# Patient Record
Sex: Female | Born: 1985 | Race: White | Hispanic: No | Marital: Married | Smoking: Former smoker
Health system: Southern US, Community
[De-identification: ages and names within clinical notes are randomized; demographics above are authoritative.]

## PROBLEM LIST (undated history)

## (undated) DIAGNOSIS — M5416 Radiculopathy, lumbar region: Secondary | ICD-10-CM

## (undated) DIAGNOSIS — M2550 Pain in unspecified joint: Secondary | ICD-10-CM

## (undated) DIAGNOSIS — K2970 Gastritis, unspecified, without bleeding: Secondary | ICD-10-CM

## (undated) DIAGNOSIS — M5431 Sciatica, right side: Secondary | ICD-10-CM

## (undated) DIAGNOSIS — K219 Gastro-esophageal reflux disease without esophagitis: Secondary | ICD-10-CM

## (undated) DIAGNOSIS — E049 Nontoxic goiter, unspecified: Secondary | ICD-10-CM

## (undated) HISTORY — DX: Pain in unspecified joint: M25.50

## (undated) HISTORY — PX: OTHER SURGICAL HISTORY: SHX169

## (undated) HISTORY — DX: Gastro-esophageal reflux disease without esophagitis: K21.9

## (undated) HISTORY — DX: Radiculopathy, lumbar region: M54.16

## (undated) HISTORY — DX: Nontoxic goiter, unspecified: E04.9

## (undated) HISTORY — DX: Gastritis, unspecified, without bleeding: K29.70

## (undated) HISTORY — DX: Sciatica, right side: M54.31

---

## 2008-07-22 ENCOUNTER — Ambulatory Visit (HOSPITAL_COMMUNITY): Admission: RE | Admit: 2008-07-22 | Discharge: 2008-07-22 | Payer: Self-pay | Admitting: *Deleted

## 2008-12-30 ENCOUNTER — Emergency Department (HOSPITAL_COMMUNITY): Admission: EM | Admit: 2008-12-30 | Discharge: 2008-12-30 | Payer: Self-pay | Admitting: Emergency Medicine

## 2009-04-06 ENCOUNTER — Encounter: Admission: RE | Admit: 2009-04-06 | Discharge: 2009-04-06 | Payer: Self-pay | Admitting: Internal Medicine

## 2009-04-15 ENCOUNTER — Encounter: Admission: RE | Admit: 2009-04-15 | Discharge: 2009-04-15 | Payer: Self-pay | Admitting: Internal Medicine

## 2009-05-16 ENCOUNTER — Encounter: Admission: RE | Admit: 2009-05-16 | Discharge: 2009-05-16 | Payer: Self-pay | Admitting: Orthopedic Surgery

## 2009-10-05 HISTORY — PX: OTHER SURGICAL HISTORY: SHX169

## 2010-05-26 ENCOUNTER — Encounter: Admission: RE | Admit: 2010-05-26 | Discharge: 2010-05-26 | Payer: Self-pay | Admitting: Internal Medicine

## 2010-10-23 LAB — CBC
HCT: 36.4 % (ref 36.0–46.0)
Hemoglobin: 13.6 g/dL (ref 12.0–15.0)
WBC: 6.7 10*3/uL (ref 4.0–10.5)

## 2010-10-23 LAB — BASIC METABOLIC PANEL
Calcium: 9 mg/dL (ref 8.4–10.5)
GFR calc Af Amer: >60 mL/min (ref >60)
GFR calc non Af Amer: >60 mL/min (ref >60)
Potassium: 3.7 mEq/L (ref 3.5–5.1)
Sodium: 138 mEq/L (ref 135–145)

## 2010-10-23 LAB — DIFFERENTIAL
Basophils Absolute: 0 10*3/uL (ref 0.0–0.1)
Basophils Relative: 0 % (ref 0–1)
Lymphocytes Relative: 23 % (ref 12–46)
Monocytes Relative: 5 % (ref 3–12)
Neutro Abs: 4.8 10*3/uL (ref 1.7–7.7)
Neutrophils Relative %: 71 % (ref 43–77)

## 2010-10-23 LAB — POCT CARDIAC MARKERS
CKMB, poc: <1 ng/mL — ABNORMAL LOW (ref 1.0–8.0)
Troponin i, poc: <0.05 ng/mL (ref 0.00–0.09)

## 2010-10-23 LAB — D-DIMER, QUANTITATIVE: D-Dimer, Quant: 0.22 ug/mL-FEU (ref 0.00–0.48)

## 2012-04-29 ENCOUNTER — Other Ambulatory Visit: Payer: Self-pay | Admitting: Internal Medicine

## 2012-04-29 DIAGNOSIS — E049 Nontoxic goiter, unspecified: Secondary | ICD-10-CM

## 2012-05-08 ENCOUNTER — Ambulatory Visit
Admission: RE | Admit: 2012-05-08 | Discharge: 2012-05-08 | Disposition: A | Discharge status: Home / Self Care | Payer: Federal, State, Local not specified - PPO | Source: Ambulatory Visit | Attending: Internal Medicine | Admitting: Internal Medicine

## 2012-05-08 DIAGNOSIS — E049 Nontoxic goiter, unspecified: Secondary | ICD-10-CM

## 2014-11-04 ENCOUNTER — Other Ambulatory Visit (HOSPITAL_COMMUNITY): Payer: Self-pay | Admitting: Internal Medicine

## 2014-11-04 DIAGNOSIS — R1011 Right upper quadrant pain: Secondary | ICD-10-CM

## 2014-11-05 ENCOUNTER — Ambulatory Visit (HOSPITAL_COMMUNITY)
Admission: RE | Admit: 2014-11-05 | Discharge: 2014-11-05 | Disposition: A | Discharge status: Home / Self Care | Payer: Federal, State, Local not specified - PPO | Source: Ambulatory Visit | Attending: Internal Medicine | Admitting: Internal Medicine

## 2014-11-05 DIAGNOSIS — R1011 Right upper quadrant pain: Secondary | ICD-10-CM

## 2014-11-10 ENCOUNTER — Other Ambulatory Visit (HOSPITAL_COMMUNITY): Payer: Self-pay | Admitting: Internal Medicine

## 2014-11-10 DIAGNOSIS — R1011 Right upper quadrant pain: Secondary | ICD-10-CM

## 2014-11-10 DIAGNOSIS — R110 Nausea: Secondary | ICD-10-CM

## 2014-11-12 ENCOUNTER — Ambulatory Visit (HOSPITAL_COMMUNITY)
Admission: RE | Admit: 2014-11-12 | Discharge: 2014-11-12 | Disposition: A | Discharge status: Home / Self Care | Payer: Federal, State, Local not specified - PPO | Source: Ambulatory Visit | Attending: Internal Medicine | Admitting: Internal Medicine

## 2014-11-12 DIAGNOSIS — R1011 Right upper quadrant pain: Secondary | ICD-10-CM | POA: Diagnosis not present

## 2014-11-12 DIAGNOSIS — R110 Nausea: Secondary | ICD-10-CM | POA: Diagnosis not present

## 2014-11-12 MED ORDER — SODIUM CHLORIDE 0.9 % IJ SOLN
INTRAMUSCULAR | Status: AC
  Filled 2014-11-12: qty 45

## 2014-11-12 MED ORDER — IOHEXOL 300 MG/ML  SOLN
100.0000 mL | Freq: Once | INTRAMUSCULAR | Status: AC | PRN
  Administered 2014-11-12: 100 mL via INTRAVENOUS

## 2014-11-12 MED ORDER — SODIUM CHLORIDE 0.9 % IJ SOLN
INTRAMUSCULAR | Status: AC
  Filled 2014-11-12: qty 750

## 2015-11-11 DIAGNOSIS — M9903 Segmental and somatic dysfunction of lumbar region: Secondary | ICD-10-CM | POA: Diagnosis not present

## 2015-11-11 DIAGNOSIS — M9902 Segmental and somatic dysfunction of thoracic region: Secondary | ICD-10-CM | POA: Diagnosis not present

## 2015-11-11 DIAGNOSIS — M546 Pain in thoracic spine: Secondary | ICD-10-CM | POA: Diagnosis not present

## 2015-11-11 DIAGNOSIS — M545 Low back pain: Secondary | ICD-10-CM | POA: Diagnosis not present

## 2015-12-09 DIAGNOSIS — M545 Low back pain: Secondary | ICD-10-CM | POA: Diagnosis not present

## 2015-12-09 DIAGNOSIS — M9903 Segmental and somatic dysfunction of lumbar region: Secondary | ICD-10-CM | POA: Diagnosis not present

## 2015-12-09 DIAGNOSIS — M9902 Segmental and somatic dysfunction of thoracic region: Secondary | ICD-10-CM | POA: Diagnosis not present

## 2015-12-09 DIAGNOSIS — M546 Pain in thoracic spine: Secondary | ICD-10-CM | POA: Diagnosis not present

## 2016-02-16 DIAGNOSIS — R188 Other ascites: Secondary | ICD-10-CM | POA: Diagnosis not present

## 2016-02-16 DIAGNOSIS — R102 Pelvic and perineal pain: Secondary | ICD-10-CM | POA: Diagnosis not present

## 2016-02-17 DIAGNOSIS — R102 Pelvic and perineal pain: Secondary | ICD-10-CM | POA: Diagnosis not present

## 2016-02-21 ENCOUNTER — Other Ambulatory Visit: Payer: Self-pay | Admitting: Internal Medicine

## 2016-02-21 ENCOUNTER — Other Ambulatory Visit (HOSPITAL_COMMUNITY): Payer: Self-pay | Admitting: Internal Medicine

## 2016-02-21 ENCOUNTER — Ambulatory Visit (HOSPITAL_COMMUNITY)
Admission: RE | Admit: 2016-02-21 | Discharge: 2016-02-21 | Disposition: A | Discharge status: Home / Self Care | Payer: Federal, State, Local not specified - PPO | Source: Ambulatory Visit | Attending: Internal Medicine | Admitting: Internal Medicine

## 2016-02-21 DIAGNOSIS — R1031 Right lower quadrant pain: Secondary | ICD-10-CM

## 2016-02-21 DIAGNOSIS — R140 Abdominal distension (gaseous): Secondary | ICD-10-CM

## 2016-02-21 DIAGNOSIS — N83201 Unspecified ovarian cyst, right side: Secondary | ICD-10-CM | POA: Insufficient documentation

## 2016-02-21 DIAGNOSIS — R110 Nausea: Secondary | ICD-10-CM | POA: Insufficient documentation

## 2016-02-21 MED ORDER — IOPAMIDOL (ISOVUE-300) INJECTION 61%
100.0000 mL | Freq: Once | INTRAVENOUS | Status: AC | PRN
  Administered 2016-02-21: 100 mL via INTRAVENOUS

## 2016-02-23 ENCOUNTER — Ambulatory Visit (HOSPITAL_COMMUNITY): Payer: Federal, State, Local not specified - PPO

## 2016-02-29 DIAGNOSIS — K5900 Constipation, unspecified: Secondary | ICD-10-CM | POA: Diagnosis not present

## 2016-02-29 DIAGNOSIS — R109 Unspecified abdominal pain: Secondary | ICD-10-CM | POA: Diagnosis not present

## 2016-03-01 ENCOUNTER — Encounter: Payer: Self-pay | Admitting: Internal Medicine

## 2016-04-06 DIAGNOSIS — M545 Low back pain: Secondary | ICD-10-CM | POA: Diagnosis not present

## 2016-04-06 DIAGNOSIS — M9902 Segmental and somatic dysfunction of thoracic region: Secondary | ICD-10-CM | POA: Diagnosis not present

## 2016-04-06 DIAGNOSIS — M9903 Segmental and somatic dysfunction of lumbar region: Secondary | ICD-10-CM | POA: Diagnosis not present

## 2016-04-06 DIAGNOSIS — M546 Pain in thoracic spine: Secondary | ICD-10-CM | POA: Diagnosis not present

## 2016-05-10 ENCOUNTER — Ambulatory Visit (INDEPENDENT_AMBULATORY_CARE_PROVIDER_SITE_OTHER): Payer: Federal, State, Local not specified - PPO | Admitting: Internal Medicine

## 2016-05-10 ENCOUNTER — Encounter (INDEPENDENT_AMBULATORY_CARE_PROVIDER_SITE_OTHER): Payer: Self-pay

## 2016-05-10 ENCOUNTER — Encounter: Payer: Self-pay | Admitting: Internal Medicine

## 2016-05-10 VITALS — BP 102/62 | HR 74 | Ht 61.0 in | Wt 132.0 lb

## 2016-05-10 DIAGNOSIS — M9902 Segmental and somatic dysfunction of thoracic region: Secondary | ICD-10-CM | POA: Diagnosis not present

## 2016-05-10 DIAGNOSIS — R1031 Right lower quadrant pain: Secondary | ICD-10-CM | POA: Diagnosis not present

## 2016-05-10 DIAGNOSIS — R10813 Right lower quadrant abdominal tenderness: Secondary | ICD-10-CM

## 2016-05-10 DIAGNOSIS — M9903 Segmental and somatic dysfunction of lumbar region: Secondary | ICD-10-CM | POA: Diagnosis not present

## 2016-05-10 DIAGNOSIS — R194 Change in bowel habit: Secondary | ICD-10-CM | POA: Diagnosis not present

## 2016-05-10 DIAGNOSIS — M546 Pain in thoracic spine: Secondary | ICD-10-CM | POA: Diagnosis not present

## 2016-05-10 DIAGNOSIS — M545 Low back pain: Secondary | ICD-10-CM | POA: Diagnosis not present

## 2016-05-10 MED ORDER — DICYCLOMINE HCL 20 MG PO TABS: 20.0000 mg | ORAL_TABLET | Freq: Four times a day (QID) | ORAL | 0 refills | Status: AC | PRN

## 2016-05-10 NOTE — Patient Instructions (Signed)
   We have sent the following medications to your pharmacy for you to pick up at your convenience: Bentyl   You have been scheduled for a colonoscopy. Please follow written instructions given to you at your visit today.  Please pick up your prep supplies at the pharmacy. If you use inhalers (even only as needed), please bring them with you on the day of your procedure. Your physician has requested that you go to www.startemmi.com and enter the access code given to you at your visit today. This web site gives a general overview about your procedure. However, you should still follow specific instructions given to you by our office regarding your preparation for the procedure.     I appreciate the opportunity to care for you. Denice Paradisearl Gessner,MD, [facility]

## 2016-05-10 NOTE — Progress Notes (Signed)
Misty Mendoza 30 y.o. 01/18/1986 [id number] Referred by: Georgianne Fick, MD  Assessment & Plan:   Encounter Diagnoses  Name Primary?  . RLQ abdominal pain Yes  . Change in bowel habits   . Right lower quadrant abdominal tenderness without rebound tenderness    Cause not clear - fullness in RLQ  IBD - ? Crohn's Cancer possible but seems unlikely by age and CT findings  If colonoscopy negative could need laporoscpy  The risks and benefits as well as alternatives of endoscopic procedure(s) have been discussed and reviewed. All questions answered. The patient agrees to proceed.  Trial of dicyclomine  I appreciate the opportunity to care for this patient. CC: RAMACHANDRAN,AJITH, MD    Subjective:   Chief Complaint: Right lower quadrant pain  HPI There are nice 30 year old married white woman who began to have right lower quadrant pain after returning from a month tore of duty in [place], she is full-time Hydrographic surveyor. She was there, she started her. When she came home and she developed some right lower quadrant pain. Not unusual, does have a history of ovarian cysts. Pain was somewhat similar but it persisted. It's been associated with a change in bowel habits where she is either loose runny or soft but not formed and regular. Denies any bleeding. Has had CT scan of the abdomen and pelvis as well as a pelvic ultrasound or gynecology office. Ovarian cyst seen, but even after those have resolved significant persistent pain and a change in bowel habits is reported. Denies unintentional weight loss. I have reviewed her primary care notes from Dr. Nicholos Johns. A grandmother and aunt have had colon cancer. The patient is somewhat concerned about the family history and her persistent symptoms. She's had some abdominal pain before but never persistent like this for weeks at a time. Primary care note of 02/29/2016 reviewed. No Known Allergies No outpatient prescriptions prior  to visit.   No facility-administered medications prior to visit.    Past Medical History:  Diagnosis Date  . Arthralgia    right hip  . Gastritis   . Lumbar radiculopathy, right   . Sciatica of right side   . Thyroid enlargement    right   Past Surgical History:  Procedure Laterality Date  . right hip arthroscopy  10/05/2009   Social History   Social History  . Marital status: Married    Spouse name: N/A  . Number of children: 1  . Years of education: N/A   Occupational History  . HR    Social History Main Topics  . Smoking status: Never Smoker  . Smokeless tobacco: Never Used  . Alcohol use Yes     Comment: rarely- social events  . Drug use: No  . Sexual activity: Not Asked   Other Topics Concern  . 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 She's married with one daughter. One caffeinated beverage daily no alcohol.   05/10/2016      Family History  Problem Relation Age of Onset  . Heart attack Father     in his 19's   . Heart disease Father   . Breast cancer Mother   . Colon cancer Maternal Grandmother   . Colon cancer Maternal Aunt         Review of Systems  As per history of present illness. Low back pain. All other review of systems negative. Objective:   Physical Exam @BP  102/62  Pulse 74   Ht 5\' 1"  (1.549 m)   Wt 132 lb (59.9 kg)   LMP 05/02/2016 (Exact Date)   BMI 24.94 kg/m @  General:  Well-developed, well-nourished and in no acute distress Eyes:  anicteric. ENT:   Mouth and posterior pharynx free of lesions.  Neck:   supple w/o thyromegaly or mass.  Lungs: Clear to auscultation bilaterally. Heart:  S1S2, no rubs, murmurs, gallops. Abdomen:  soft, mildly tender w/ fullness in RLQ, no hepatosplenomegaly, hernia, or mass and BS+.  Rectal: deferred Lymph:  no cervical or supraclavicular adenopathy. Extremities:   no edema, cyanosis or clubbing Skin   no rash. Tattoo low back Neuro:    A&O x 3.  Psych:  appropriate mood and  Affect.   Data Reviewed:   IMPRESSION: 1. No definite explanation for the patient's right lower quadrant pain is seen. The appendix and terminal ileum are unremarkable. No edema of large or small bowel is noted. 2. No renal or ureteral calculi are noted. 3. 1.9 cm right ovarian cyst with a moderate amount of free fluid in the pelvis, possibly due to a recently ruptured ovarian cyst.   Electronically Signed   By: Dwyane DeePaul  Barry M.D.   On: 02/21/2016 12:10

## 2016-05-25 ENCOUNTER — Encounter: Payer: Self-pay | Admitting: Internal Medicine

## 2016-05-25 ENCOUNTER — Telehealth: Payer: Self-pay

## 2016-05-25 ENCOUNTER — Ambulatory Visit (AMBULATORY_SURGERY_CENTER): Payer: Federal, State, Local not specified - PPO | Admitting: Internal Medicine

## 2016-05-25 VITALS — BP 121/65 | HR 65 | Temp 98.6°F | Resp 15 | Ht 61.0 in | Wt 132.0 lb

## 2016-05-25 DIAGNOSIS — R195 Other fecal abnormalities: Secondary | ICD-10-CM | POA: Diagnosis not present

## 2016-05-25 DIAGNOSIS — R1031 Right lower quadrant pain: Secondary | ICD-10-CM | POA: Diagnosis not present

## 2016-05-25 DIAGNOSIS — Z1211 Encounter for screening for malignant neoplasm of colon: Secondary | ICD-10-CM | POA: Diagnosis not present

## 2016-05-25 MED ORDER — SODIUM CHLORIDE 0.9 % IV SOLN: 500.0000 mL | INTRAVENOUS | Status: AC

## 2016-05-25 NOTE — Patient Instructions (Addendum)
Everything looks ok.  It sounds like you have IBS or Irritable Bowel syndrome.   Will discuss other treatment.  May need to consider a diagnostic laparoscopy.  I appreciate the opportunity to care for you. Iva Booparl E. Careena Degraffenreid, MD, FACG YOU HAD AN ENDOSCOPIC PROCEDURE TODAY AT THE [facility]:   Refer to the procedure report that was given to you for any specific questions about what was found during the examination.  If the procedure report does not answer your questions, please call your gastroenterologist to clarify.  If you requested that your care partner not be given the details of your procedure findings, then the procedure report has been included in a sealed envelope for you to review at your convenience later.  YOU SHOULD EXPECT: Some feelings of bloating in the abdomen. Passage of more gas than usual.  Walking can help get rid of the air that was put into your GI tract during the procedure and reduce the bloating. If you had a lower endoscopy (such as a colonoscopy or flexible sigmoidoscopy) you may notice spotting of blood in your stool or on the toilet paper. If you underwent a bowel prep for your procedure, you may not have a normal bowel movement for a few days.  Please Note:  You might notice some irritation and congestion in your nose or some drainage.  This is from the oxygen used during your procedure.  There is no need for concern and it should clear up in a day or so.  SYMPTOMS TO REPORT IMMEDIATELY:   Following lower endoscopy (colonoscopy or flexible sigmoidoscopy):  Excessive amounts of blood in the stool  Significant tenderness or worsening of abdominal pains  Swelling of the abdomen that is new, acute  Fever of 100F or higher   Following upper endoscopy (EGD)  Vomiting of blood or coffee ground material  New chest pain or pain under the shoulder blades  Painful or persistently difficult swallowing  New shortness of breath  Fever of 100F or  higher  Black, tarry-looking stools  For urgent or emergent issues, a gastroenterologist can be reached at any hour by calling [phone].   DIET:  We do recommend a small meal at first, but then you may proceed to your regular diet.  Drink plenty of fluids but you should avoid alcoholic beverages for 24 hours.  ACTIVITY:  You should plan to take it easy for the rest of today and you should NOT DRIVE or use heavy machinery until tomorrow (because of the sedation medicines used during the test).    FOLLOW UP: Our staff will call the number listed on your records the next business day following your procedure to check on you and address any questions or concerns that you may have regarding the information given to you following your procedure. If we do not reach you, we will leave a message.  However, if you are feeling well and you are not experiencing any problems, there is no need to return our call.  We will assume that you have returned to your regular daily activities without incident.  If any biopsies were taken you will be contacted by phone or by letter within the next 1-3 weeks.  Please call us at [phone] if you have not heard about the biopsies in 3 weeks.    SIGNATURES/CONFIDENTIALITY: You and/or your care partner have signed paperwork which will be entered into your electronic medical record.  These signatures attest to the fact that  that the information above on your After Visit Summary has been reviewed and is understood.  Full responsibility of the confidentiality of this discharge information lies with you and/or your care-partner.  Will do lactulose breath atest.  May need to consider laparoscopy.

## 2016-05-25 NOTE — Op Note (Addendum)
[facility] Patient Name: Misty BradleyJulie Mendoza Procedure Date: 05/25/2016 9:12 AM MRN: [id number] Endoscopist: Iva Booparl E Gessner , MD Age: 30 Referring MD:  Date of Birth: Dec 23, 1985 Gender: Female Account #: [id number] Procedure:                Colonoscopy Indications:              Abdominal pain in the right lower quadrant,                            Clinically significant diarrhea of unexplained                            origin Medicines:                Propofol per Anesthesia, Monitored Anesthesia Care Procedure:                Pre-Anesthesia Assessment:                           - Prior to the procedure, a History and Physical                            was performed, and patient medications and                            allergies were reviewed. The patient's tolerance of                            previous anesthesia was also reviewed. The risks                            and benefits of the procedure and the sedation                            options and risks were discussed with the patient.                            All questions were answered, and informed consent                            was obtained. Prior Anticoagulants: The patient has                            taken no previous anticoagulant or antiplatelet                            agents. ASA Grade Assessment: II - A patient with                            mild systemic disease. After reviewing the risks                            and benefits, the patient was deemed in  satisfactory condition to undergo the procedure.                           After obtaining informed consent, the colonoscope                            was passed under direct vision. Throughout the                            procedure, the patient's blood pressure, pulse, and                            oxygen saturations were monitored continuously. The                            Model PCF-H190DL [id number](SN#[id number]) scope  was introduced                            through the anus and advanced to the the terminal                            ileum, with identification of the appendiceal                            orifice and IC valve. The colonoscopy was performed                            without difficulty. The patient tolerated the                            procedure well. The quality of the bowel                            preparation was excellent. The bowel preparation                            used was Miralax. The terminal ileum, the                            appendiceal orifice and the rectum were                            photographed. Scope In: 9:20:58 AM Scope Out: 9:35:03 AM Scope Withdrawal Time: 0 hours 9 minutes 58 seconds  Total Procedure Duration: 0 hours 14 minutes 5 seconds  Findings:                 The perianal and digital rectal examinations were                            normal.                           The terminal ileum appeared normal.  The entire examined colon appeared normal on direct                            and retroflexion views. Complications:            No immediate complications. Estimated Blood Loss:     Estimated blood loss: none. Impression:               - The examined portion of the ileum was normal.                           - The entire examined colon is normal on direct and                            retroflexion views.                           - No specimens collected. Recommendation:           - Patient has a contact number available for                            emergencies. The signs and symptoms of potential                            delayed complications were discussed with the                            patient. Return to normal activities tomorrow.                            Written discharge instructions were provided to the                            patient.                           - Resume previous diet.                            - Continue present medications.                           - No repeat colonoscopy due to age.                           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 SIBO - bloating, loose stools                            and pain - will do a lactulose hydrogen breath test                           Dicyclomine dc - "made me feel terrible"  May still need to consider a laparoscopy                           cc: Dr. Quinn Axe, [place] Iva Boop, MD 05/25/2016 9:48:38 AM This report has been signed electronically.

## 2016-05-25 NOTE — Telephone Encounter (Signed)
-----   Message from Iva Booparl E Gessner, MD sent at 05/25/2016  9:59 AM EST ----- Regarding: breath test Need to have her do a lactulose hydrogen breat test  Dx diarrhea, abdominal bloating , RLQ pain

## 2016-05-25 NOTE — Progress Notes (Signed)
A and O x3. Report to RN. Tolerated MAC anesthesia well. 

## 2016-05-28 ENCOUNTER — Telehealth: Payer: Self-pay | Admitting: *Deleted

## 2016-05-28 NOTE — Telephone Encounter (Signed)
Per Dr. Leone PayorGessner patient needs to wait one month after colonoscopy to do breath test.  Patient notified and I mailed her the form to sign.  She will mail it back or fax it back to me.

## 2016-05-28 NOTE — Telephone Encounter (Signed)
  Follow up Call-  Call back number 05/25/2016  Post procedure Call Back phone  # [phone]  Permission to leave phone message Yes  Some recent data might be hidden     Patient questions:  Do you have a fever, pain , or abdominal swelling? No. Pain Score  0 *  Have you tolerated food without any problems? Yes.    Have you been able to return to your normal activities? Yes.    Do you have any questions about your discharge instructions: Diet   No. Medications  No. Follow up visit  No.  Do you have questions or concerns about your Care? No.  Actions: * If pain score is 4 or above: No action needed, pain <4.

## 2016-06-11 DIAGNOSIS — M546 Pain in thoracic spine: Secondary | ICD-10-CM | POA: Diagnosis not present

## 2016-06-11 DIAGNOSIS — M9902 Segmental and somatic dysfunction of thoracic region: Secondary | ICD-10-CM | POA: Diagnosis not present

## 2016-06-11 DIAGNOSIS — Z01419 Encounter for gynecological examination (general) (routine) without abnormal findings: Secondary | ICD-10-CM | POA: Diagnosis not present

## 2016-06-11 DIAGNOSIS — M9903 Segmental and somatic dysfunction of lumbar region: Secondary | ICD-10-CM | POA: Diagnosis not present

## 2016-06-11 DIAGNOSIS — Z6825 Body mass index (BMI) 25.0-25.9, adult: Secondary | ICD-10-CM | POA: Diagnosis not present

## 2016-06-11 DIAGNOSIS — M545 Low back pain: Secondary | ICD-10-CM | POA: Diagnosis not present

## 2016-07-06 DIAGNOSIS — M9903 Segmental and somatic dysfunction of lumbar region: Secondary | ICD-10-CM | POA: Diagnosis not present

## 2016-07-06 DIAGNOSIS — M545 Low back pain: Secondary | ICD-10-CM | POA: Diagnosis not present

## 2016-07-06 DIAGNOSIS — M546 Pain in thoracic spine: Secondary | ICD-10-CM | POA: Diagnosis not present

## 2016-07-06 DIAGNOSIS — M9902 Segmental and somatic dysfunction of thoracic region: Secondary | ICD-10-CM | POA: Diagnosis not present

## 2016-08-06 DIAGNOSIS — M9901 Segmental and somatic dysfunction of cervical region: Secondary | ICD-10-CM | POA: Diagnosis not present

## 2016-08-06 DIAGNOSIS — N809 Endometriosis, unspecified: Secondary | ICD-10-CM | POA: Diagnosis not present

## 2016-08-06 DIAGNOSIS — Z3202 Encounter for pregnancy test, result negative: Secondary | ICD-10-CM | POA: Diagnosis not present

## 2016-08-06 DIAGNOSIS — M542 Cervicalgia: Secondary | ICD-10-CM | POA: Diagnosis not present

## 2016-08-06 DIAGNOSIS — M9903 Segmental and somatic dysfunction of lumbar region: Secondary | ICD-10-CM | POA: Diagnosis not present

## 2016-08-06 DIAGNOSIS — M545 Low back pain: Secondary | ICD-10-CM | POA: Diagnosis not present

## 2016-08-06 DIAGNOSIS — Z6825 Body mass index (BMI) 25.0-25.9, adult: Secondary | ICD-10-CM | POA: Diagnosis not present

## 2016-08-14 DIAGNOSIS — Z13220 Encounter for screening for lipoid disorders: Secondary | ICD-10-CM | POA: Diagnosis not present

## 2016-08-14 DIAGNOSIS — Z0000 Encounter for general adult medical examination without abnormal findings: Secondary | ICD-10-CM | POA: Diagnosis not present

## 2016-08-21 DIAGNOSIS — M25551 Pain in right hip: Secondary | ICD-10-CM | POA: Diagnosis not present

## 2016-08-21 DIAGNOSIS — R1031 Right lower quadrant pain: Secondary | ICD-10-CM | POA: Diagnosis not present

## 2016-08-21 DIAGNOSIS — Z0000 Encounter for general adult medical examination without abnormal findings: Secondary | ICD-10-CM | POA: Diagnosis not present

## 2016-08-27 DIAGNOSIS — R102 Pelvic and perineal pain: Secondary | ICD-10-CM | POA: Diagnosis not present

## 2016-08-27 DIAGNOSIS — G8929 Other chronic pain: Secondary | ICD-10-CM | POA: Diagnosis not present

## 2016-08-27 DIAGNOSIS — N803 Endometriosis of pelvic peritoneum: Secondary | ICD-10-CM | POA: Diagnosis not present

## 2016-08-27 DIAGNOSIS — Z833 Family history of diabetes mellitus: Secondary | ICD-10-CM | POA: Diagnosis not present

## 2016-08-27 DIAGNOSIS — Z793 Long term (current) use of hormonal contraceptives: Secondary | ICD-10-CM | POA: Diagnosis not present

## 2016-08-27 DIAGNOSIS — Z87891 Personal history of nicotine dependence: Secondary | ICD-10-CM | POA: Diagnosis not present

## 2016-08-28 DIAGNOSIS — Z87891 Personal history of nicotine dependence: Secondary | ICD-10-CM | POA: Diagnosis not present

## 2016-08-28 DIAGNOSIS — Z793 Long term (current) use of hormonal contraceptives: Secondary | ICD-10-CM | POA: Diagnosis not present

## 2016-08-28 DIAGNOSIS — R102 Pelvic and perineal pain: Secondary | ICD-10-CM | POA: Diagnosis not present

## 2016-08-28 DIAGNOSIS — Z833 Family history of diabetes mellitus: Secondary | ICD-10-CM | POA: Diagnosis not present

## 2016-08-28 DIAGNOSIS — N803 Endometriosis of pelvic peritoneum: Secondary | ICD-10-CM | POA: Diagnosis not present

## 2016-08-28 DIAGNOSIS — G8929 Other chronic pain: Secondary | ICD-10-CM | POA: Diagnosis not present

## 2016-09-10 DIAGNOSIS — M542 Cervicalgia: Secondary | ICD-10-CM | POA: Diagnosis not present

## 2016-09-10 DIAGNOSIS — M545 Low back pain: Secondary | ICD-10-CM | POA: Diagnosis not present

## 2016-09-10 DIAGNOSIS — M9903 Segmental and somatic dysfunction of lumbar region: Secondary | ICD-10-CM | POA: Diagnosis not present

## 2016-09-10 DIAGNOSIS — M9901 Segmental and somatic dysfunction of cervical region: Secondary | ICD-10-CM | POA: Diagnosis not present

## 2016-10-08 DIAGNOSIS — M9901 Segmental and somatic dysfunction of cervical region: Secondary | ICD-10-CM | POA: Diagnosis not present

## 2016-10-08 DIAGNOSIS — M542 Cervicalgia: Secondary | ICD-10-CM | POA: Diagnosis not present

## 2016-10-08 DIAGNOSIS — M545 Low back pain: Secondary | ICD-10-CM | POA: Diagnosis not present

## 2016-10-08 DIAGNOSIS — M9903 Segmental and somatic dysfunction of lumbar region: Secondary | ICD-10-CM | POA: Diagnosis not present

## 2016-11-14 DIAGNOSIS — M9901 Segmental and somatic dysfunction of cervical region: Secondary | ICD-10-CM | POA: Diagnosis not present

## 2016-11-14 DIAGNOSIS — M545 Low back pain: Secondary | ICD-10-CM | POA: Diagnosis not present

## 2016-11-14 DIAGNOSIS — M542 Cervicalgia: Secondary | ICD-10-CM | POA: Diagnosis not present

## 2016-11-14 DIAGNOSIS — M9903 Segmental and somatic dysfunction of lumbar region: Secondary | ICD-10-CM | POA: Diagnosis not present

## 2016-11-15 DIAGNOSIS — M545 Low back pain: Secondary | ICD-10-CM | POA: Diagnosis not present

## 2016-11-15 DIAGNOSIS — M542 Cervicalgia: Secondary | ICD-10-CM | POA: Diagnosis not present

## 2016-11-15 DIAGNOSIS — M9903 Segmental and somatic dysfunction of lumbar region: Secondary | ICD-10-CM | POA: Diagnosis not present

## 2016-11-15 DIAGNOSIS — M9901 Segmental and somatic dysfunction of cervical region: Secondary | ICD-10-CM | POA: Diagnosis not present

## 2016-12-12 DIAGNOSIS — M545 Low back pain: Secondary | ICD-10-CM | POA: Diagnosis not present

## 2016-12-12 DIAGNOSIS — M9903 Segmental and somatic dysfunction of lumbar region: Secondary | ICD-10-CM | POA: Diagnosis not present

## 2016-12-12 DIAGNOSIS — M9901 Segmental and somatic dysfunction of cervical region: Secondary | ICD-10-CM | POA: Diagnosis not present

## 2016-12-12 DIAGNOSIS — M542 Cervicalgia: Secondary | ICD-10-CM | POA: Diagnosis not present

## 2017-01-09 DIAGNOSIS — M542 Cervicalgia: Secondary | ICD-10-CM | POA: Diagnosis not present

## 2017-01-09 DIAGNOSIS — M9903 Segmental and somatic dysfunction of lumbar region: Secondary | ICD-10-CM | POA: Diagnosis not present

## 2017-01-09 DIAGNOSIS — M9901 Segmental and somatic dysfunction of cervical region: Secondary | ICD-10-CM | POA: Diagnosis not present

## 2017-01-09 DIAGNOSIS — M545 Low back pain: Secondary | ICD-10-CM | POA: Diagnosis not present

## 2017-01-14 DIAGNOSIS — Z9889 Other specified postprocedural states: Secondary | ICD-10-CM | POA: Diagnosis not present

## 2017-01-14 DIAGNOSIS — M25551 Pain in right hip: Secondary | ICD-10-CM | POA: Diagnosis not present

## 2017-02-11 DIAGNOSIS — M542 Cervicalgia: Secondary | ICD-10-CM | POA: Diagnosis not present

## 2017-02-11 DIAGNOSIS — M9901 Segmental and somatic dysfunction of cervical region: Secondary | ICD-10-CM | POA: Diagnosis not present

## 2017-02-11 DIAGNOSIS — M545 Low back pain: Secondary | ICD-10-CM | POA: Diagnosis not present

## 2017-02-11 DIAGNOSIS — M9903 Segmental and somatic dysfunction of lumbar region: Secondary | ICD-10-CM | POA: Diagnosis not present

## 2017-03-14 DIAGNOSIS — M9903 Segmental and somatic dysfunction of lumbar region: Secondary | ICD-10-CM | POA: Diagnosis not present

## 2017-03-14 DIAGNOSIS — M542 Cervicalgia: Secondary | ICD-10-CM | POA: Diagnosis not present

## 2017-03-14 DIAGNOSIS — M9901 Segmental and somatic dysfunction of cervical region: Secondary | ICD-10-CM | POA: Diagnosis not present

## 2017-03-14 DIAGNOSIS — M545 Low back pain: Secondary | ICD-10-CM | POA: Diagnosis not present

## 2017-04-16 DIAGNOSIS — M545 Low back pain: Secondary | ICD-10-CM | POA: Diagnosis not present

## 2017-04-16 DIAGNOSIS — M9903 Segmental and somatic dysfunction of lumbar region: Secondary | ICD-10-CM | POA: Diagnosis not present

## 2017-04-16 DIAGNOSIS — M9901 Segmental and somatic dysfunction of cervical region: Secondary | ICD-10-CM | POA: Diagnosis not present

## 2017-04-16 DIAGNOSIS — M542 Cervicalgia: Secondary | ICD-10-CM | POA: Diagnosis not present

## 2017-05-21 DIAGNOSIS — M9901 Segmental and somatic dysfunction of cervical region: Secondary | ICD-10-CM | POA: Diagnosis not present

## 2017-05-21 DIAGNOSIS — M542 Cervicalgia: Secondary | ICD-10-CM | POA: Diagnosis not present

## 2017-05-21 DIAGNOSIS — M545 Low back pain: Secondary | ICD-10-CM | POA: Diagnosis not present

## 2017-05-21 DIAGNOSIS — M9903 Segmental and somatic dysfunction of lumbar region: Secondary | ICD-10-CM | POA: Diagnosis not present

## 2017-06-12 DIAGNOSIS — Z01419 Encounter for gynecological examination (general) (routine) without abnormal findings: Secondary | ICD-10-CM | POA: Diagnosis not present

## 2017-06-12 DIAGNOSIS — Z6824 Body mass index (BMI) 24.0-24.9, adult: Secondary | ICD-10-CM | POA: Diagnosis not present

## 2017-06-28 DIAGNOSIS — M9903 Segmental and somatic dysfunction of lumbar region: Secondary | ICD-10-CM | POA: Diagnosis not present

## 2017-06-28 DIAGNOSIS — M542 Cervicalgia: Secondary | ICD-10-CM | POA: Diagnosis not present

## 2017-06-28 DIAGNOSIS — M545 Low back pain: Secondary | ICD-10-CM | POA: Diagnosis not present

## 2017-06-28 DIAGNOSIS — M9901 Segmental and somatic dysfunction of cervical region: Secondary | ICD-10-CM | POA: Diagnosis not present

## 2017-07-22 DIAGNOSIS — M545 Low back pain: Secondary | ICD-10-CM | POA: Diagnosis not present

## 2017-07-22 DIAGNOSIS — M542 Cervicalgia: Secondary | ICD-10-CM | POA: Diagnosis not present

## 2017-07-22 DIAGNOSIS — M9903 Segmental and somatic dysfunction of lumbar region: Secondary | ICD-10-CM | POA: Diagnosis not present

## 2017-07-22 DIAGNOSIS — M9901 Segmental and somatic dysfunction of cervical region: Secondary | ICD-10-CM | POA: Diagnosis not present

## 2017-08-19 DIAGNOSIS — M542 Cervicalgia: Secondary | ICD-10-CM | POA: Diagnosis not present

## 2017-08-19 DIAGNOSIS — M545 Low back pain: Secondary | ICD-10-CM | POA: Diagnosis not present

## 2017-08-19 DIAGNOSIS — M9901 Segmental and somatic dysfunction of cervical region: Secondary | ICD-10-CM | POA: Diagnosis not present

## 2017-08-19 DIAGNOSIS — M9903 Segmental and somatic dysfunction of lumbar region: Secondary | ICD-10-CM | POA: Diagnosis not present

## 2017-08-21 DIAGNOSIS — Z131 Encounter for screening for diabetes mellitus: Secondary | ICD-10-CM | POA: Diagnosis not present

## 2017-08-21 DIAGNOSIS — Z0000 Encounter for general adult medical examination without abnormal findings: Secondary | ICD-10-CM | POA: Diagnosis not present

## 2017-08-28 DIAGNOSIS — L709 Acne, unspecified: Secondary | ICD-10-CM | POA: Diagnosis not present

## 2017-08-28 DIAGNOSIS — Z0000 Encounter for general adult medical examination without abnormal findings: Secondary | ICD-10-CM | POA: Diagnosis not present

## 2017-09-16 DIAGNOSIS — M545 Low back pain: Secondary | ICD-10-CM | POA: Diagnosis not present

## 2017-09-16 DIAGNOSIS — M542 Cervicalgia: Secondary | ICD-10-CM | POA: Diagnosis not present

## 2017-09-16 DIAGNOSIS — M9903 Segmental and somatic dysfunction of lumbar region: Secondary | ICD-10-CM | POA: Diagnosis not present

## 2017-09-16 DIAGNOSIS — M9901 Segmental and somatic dysfunction of cervical region: Secondary | ICD-10-CM | POA: Diagnosis not present

## 2017-09-17 DIAGNOSIS — J019 Acute sinusitis, unspecified: Secondary | ICD-10-CM | POA: Diagnosis not present

## 2017-09-17 DIAGNOSIS — B9689 Other specified bacterial agents as the cause of diseases classified elsewhere: Secondary | ICD-10-CM | POA: Diagnosis not present

## 2017-09-17 DIAGNOSIS — J029 Acute pharyngitis, unspecified: Secondary | ICD-10-CM | POA: Diagnosis not present

## 2017-10-14 DIAGNOSIS — M9902 Segmental and somatic dysfunction of thoracic region: Secondary | ICD-10-CM | POA: Diagnosis not present

## 2017-10-14 DIAGNOSIS — M9903 Segmental and somatic dysfunction of lumbar region: Secondary | ICD-10-CM | POA: Diagnosis not present

## 2017-10-14 DIAGNOSIS — M546 Pain in thoracic spine: Secondary | ICD-10-CM | POA: Diagnosis not present

## 2017-10-14 DIAGNOSIS — M545 Low back pain: Secondary | ICD-10-CM | POA: Diagnosis not present

## 2017-11-04 DIAGNOSIS — Z3202 Encounter for pregnancy test, result negative: Secondary | ICD-10-CM | POA: Diagnosis not present

## 2017-11-04 DIAGNOSIS — R102 Pelvic and perineal pain: Secondary | ICD-10-CM | POA: Diagnosis not present

## 2017-11-04 DIAGNOSIS — Z6825 Body mass index (BMI) 25.0-25.9, adult: Secondary | ICD-10-CM | POA: Diagnosis not present

## 2017-11-08 DIAGNOSIS — N83201 Unspecified ovarian cyst, right side: Secondary | ICD-10-CM | POA: Diagnosis not present

## 2017-11-08 DIAGNOSIS — N939 Abnormal uterine and vaginal bleeding, unspecified: Secondary | ICD-10-CM | POA: Diagnosis not present

## 2017-11-08 DIAGNOSIS — Z6825 Body mass index (BMI) 25.0-25.9, adult: Secondary | ICD-10-CM | POA: Diagnosis not present

## 2017-11-08 DIAGNOSIS — R102 Pelvic and perineal pain: Secondary | ICD-10-CM | POA: Diagnosis not present

## 2017-11-11 DIAGNOSIS — M545 Low back pain: Secondary | ICD-10-CM | POA: Diagnosis not present

## 2017-11-11 DIAGNOSIS — M9903 Segmental and somatic dysfunction of lumbar region: Secondary | ICD-10-CM | POA: Diagnosis not present

## 2017-11-11 DIAGNOSIS — M9902 Segmental and somatic dysfunction of thoracic region: Secondary | ICD-10-CM | POA: Diagnosis not present

## 2017-11-11 DIAGNOSIS — M546 Pain in thoracic spine: Secondary | ICD-10-CM | POA: Diagnosis not present

## 2017-12-11 DIAGNOSIS — M9901 Segmental and somatic dysfunction of cervical region: Secondary | ICD-10-CM | POA: Diagnosis not present

## 2017-12-11 DIAGNOSIS — M545 Low back pain: Secondary | ICD-10-CM | POA: Diagnosis not present

## 2017-12-11 DIAGNOSIS — M9903 Segmental and somatic dysfunction of lumbar region: Secondary | ICD-10-CM | POA: Diagnosis not present

## 2017-12-11 DIAGNOSIS — M542 Cervicalgia: Secondary | ICD-10-CM | POA: Diagnosis not present

## 2018-01-20 DIAGNOSIS — M542 Cervicalgia: Secondary | ICD-10-CM | POA: Diagnosis not present

## 2018-01-20 DIAGNOSIS — M9901 Segmental and somatic dysfunction of cervical region: Secondary | ICD-10-CM | POA: Diagnosis not present

## 2018-01-20 DIAGNOSIS — M9903 Segmental and somatic dysfunction of lumbar region: Secondary | ICD-10-CM | POA: Diagnosis not present

## 2018-01-20 DIAGNOSIS — M545 Low back pain: Secondary | ICD-10-CM | POA: Diagnosis not present

## 2018-02-17 DIAGNOSIS — M545 Low back pain: Secondary | ICD-10-CM | POA: Diagnosis not present

## 2018-02-17 DIAGNOSIS — M9901 Segmental and somatic dysfunction of cervical region: Secondary | ICD-10-CM | POA: Diagnosis not present

## 2018-02-17 DIAGNOSIS — M542 Cervicalgia: Secondary | ICD-10-CM | POA: Diagnosis not present

## 2018-02-17 DIAGNOSIS — M9903 Segmental and somatic dysfunction of lumbar region: Secondary | ICD-10-CM | POA: Diagnosis not present

## 2018-03-12 DIAGNOSIS — M545 Low back pain: Secondary | ICD-10-CM | POA: Diagnosis not present

## 2018-03-12 DIAGNOSIS — M542 Cervicalgia: Secondary | ICD-10-CM | POA: Diagnosis not present

## 2018-03-12 DIAGNOSIS — M9901 Segmental and somatic dysfunction of cervical region: Secondary | ICD-10-CM | POA: Diagnosis not present

## 2018-03-12 DIAGNOSIS — M9903 Segmental and somatic dysfunction of lumbar region: Secondary | ICD-10-CM | POA: Diagnosis not present

## 2018-03-22 IMAGING — CT CT ABD-PELV W/ CM
2 of 4 series · 16 of 46 positions shown, 18 images · IV contrast (iopamidol)
Comparison: CT abdomen and pelvis of 11/12/2014

CLINICAL DATA: Right lower quadrant abdominal pain intermittently
over the last 2 weeks, nausea, bloating

EXAM:
CT ABDOMEN AND PELVIS WITH CONTRAST
TECHNIQUE: Multidetector CT imaging of the abdomen and pelvis was performed
using the standard protocol following bolus administration of
intravenous contrast.
CONTRAST:  100mL 3ETKUI-8KK IOPAMIDOL (3ETKUI-8KK) INJECTION 61%

[Series 2: routine abd pel with · axial · 0.64mm/px · z∈[+619,+1054]mm · 13 of 95 slices shown, 15 images]
[im 4/95  soft-tissue]
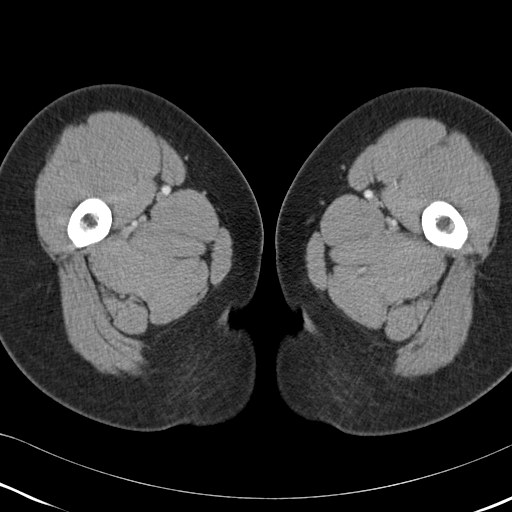
[im 4/95  bone]
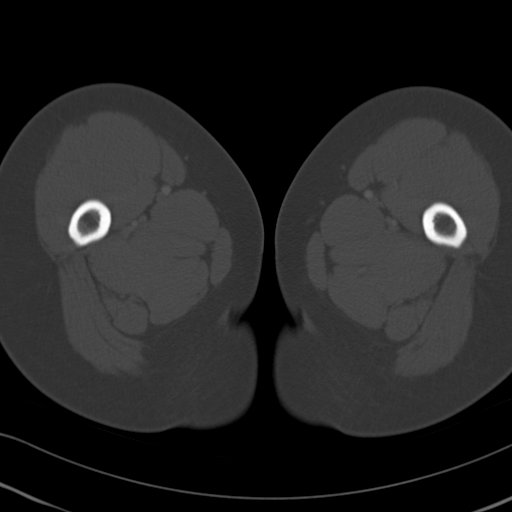
[im 12/95  soft-tissue]
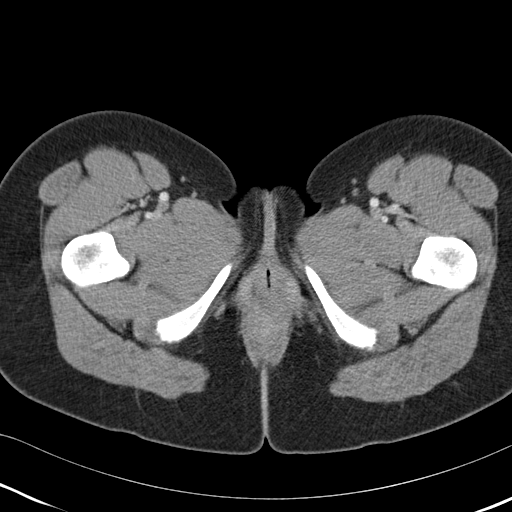
[im 19/95  soft-tissue]
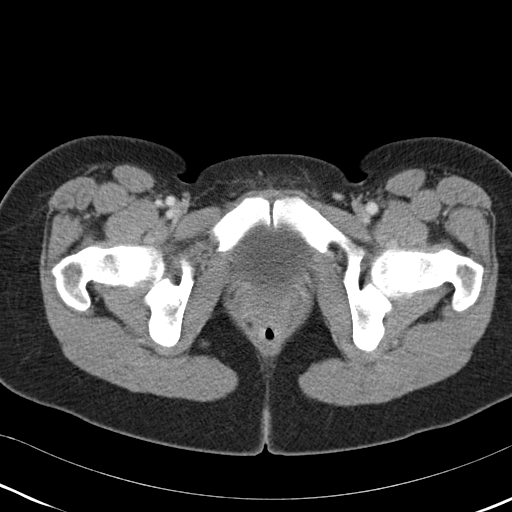
[im 27/95  soft-tissue]
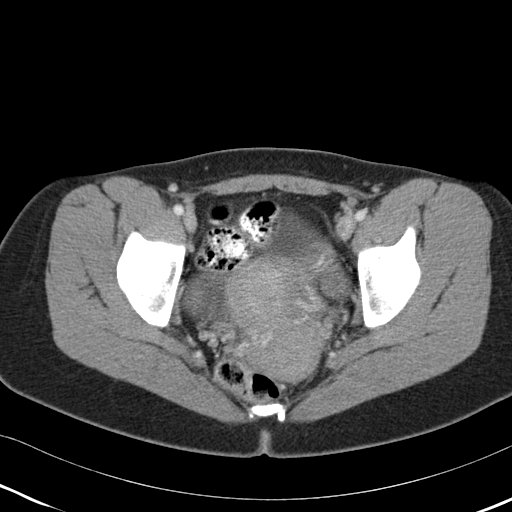
[im 34/95  soft-tissue]
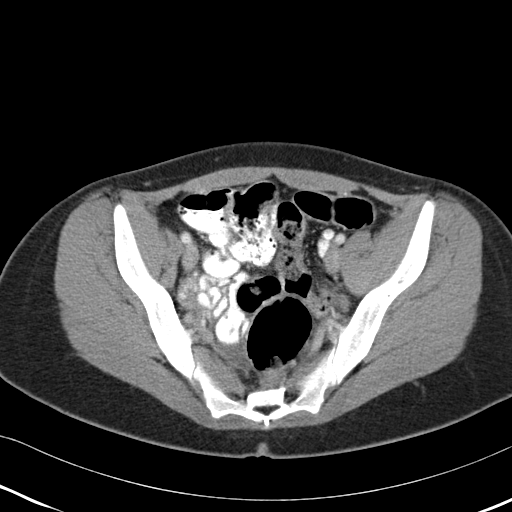
[im 42/95  soft-tissue]
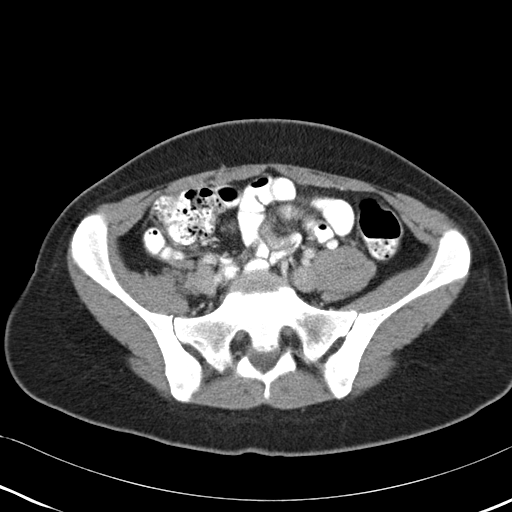
[im 49/95  soft-tissue]
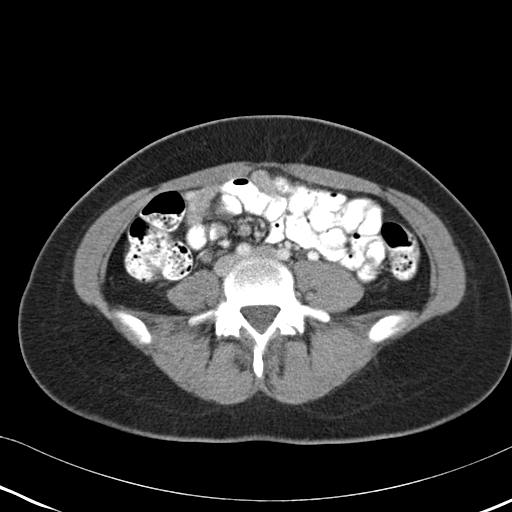
[im 53/95  soft-tissue]
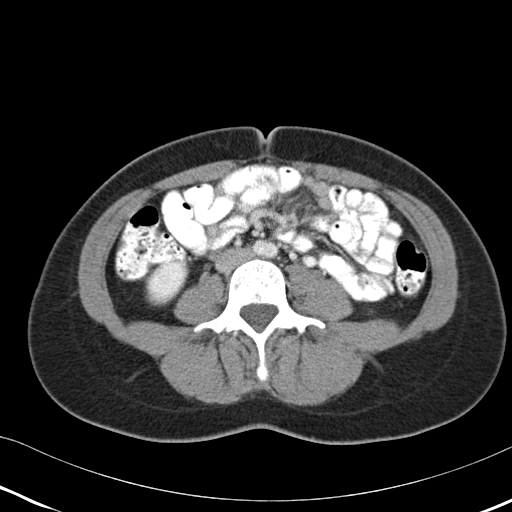
[im 61/95  soft-tissue]
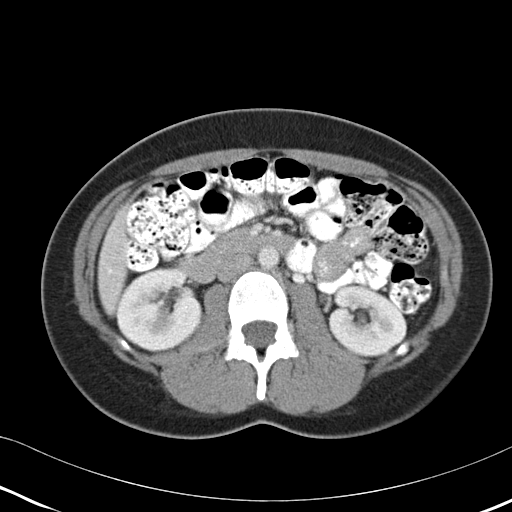
[im 61/95  bone]
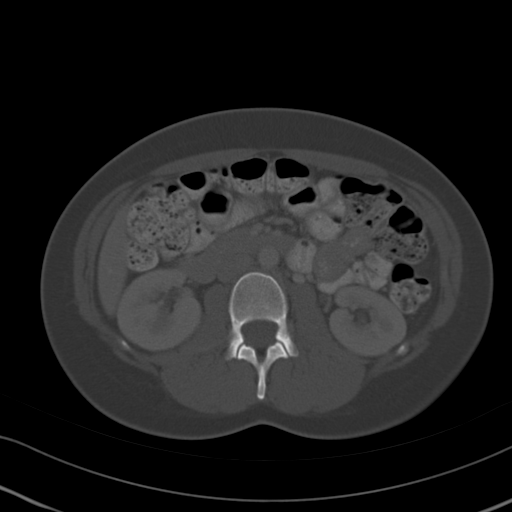
[im 68/95  soft-tissue]
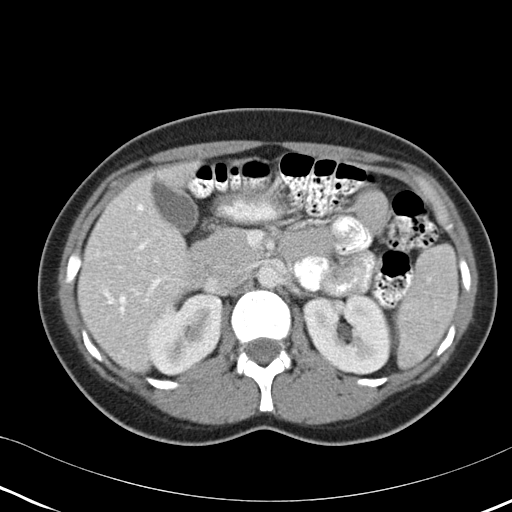
[im 76/95  soft-tissue]
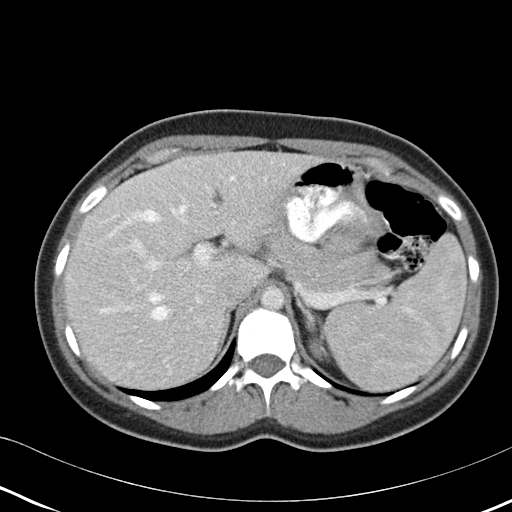
[im 83/95  soft-tissue]
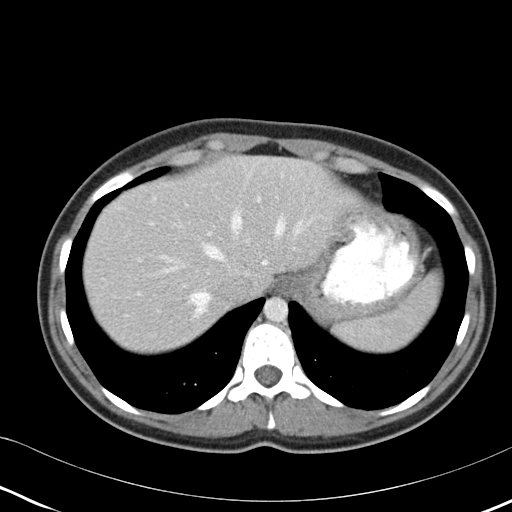
[im 91/95  soft-tissue]
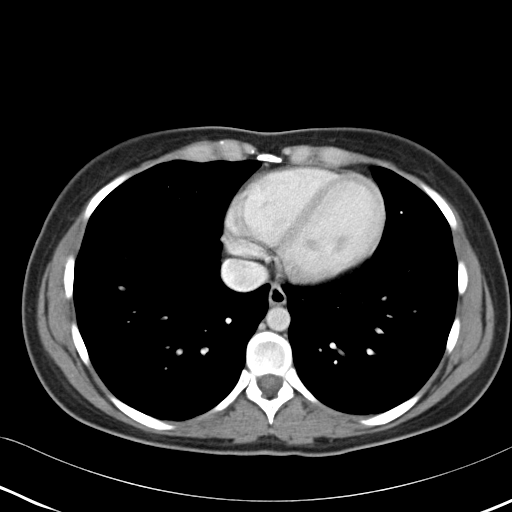

[Series 3: coronal · coronal · 0.67mm/px · 3 of 118 slices shown]
[im 40/118  soft-tissue]
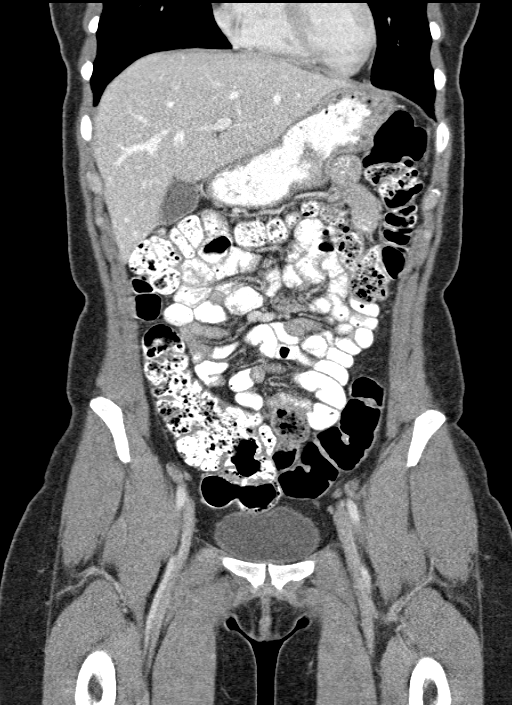
[im 53/118  soft-tissue]
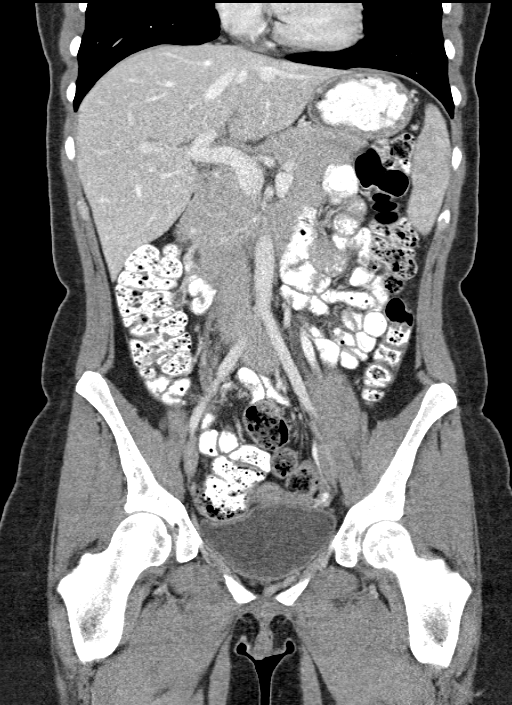
[im 66/118  soft-tissue]
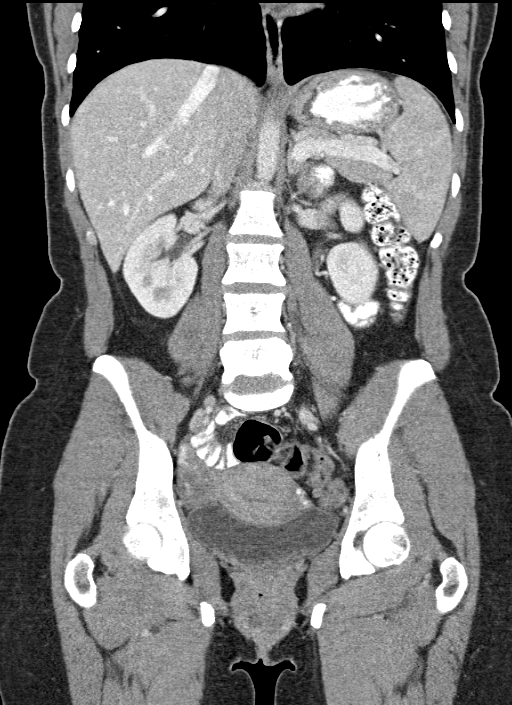

[16 of 46 positions shown; findings below may reference images not displayed]

FINDINGS: The lung bases are clear. The liver enhances with no focal
abnormality and no ductal dilatation is seen. No calcified
gallstones are noted. Thank is unremarkable with no mass or
pancreatic ductal dilatation noted. The adrenal glands and spleen
are unremarkable. The stomach is not well distended with oral
contrast. The kidneys enhance with no calculus or mass and no
hydronephrosis is noted. The abdominal aorta is normal in caliber.
No adenopathy is seen.

The terminal ileum and the appendix are well seen and are
unremarkable. No evidence of mucosal edema of large or small bowel
is seen. There is a small right ovarian cyst present of 19 mm in
diameter, with a moderate amount of free fluid in the pelvis
possibly due to a ruptured ovarian cyst. The lumbar vertebrae are in
normal alignment with normal intervertebral disc spaces.
IMPRESSION: 1. No definite explanation for the patient's right lower quadrant
pain is seen. The appendix and terminal ileum are unremarkable. No
edema of large or small bowel is noted.
2. No renal or ureteral calculi are noted.
3. 1.9 cm right ovarian cyst with a moderate amount of free fluid in
the pelvis, possibly due to a recently ruptured ovarian cyst.

## 2018-04-23 DIAGNOSIS — M9903 Segmental and somatic dysfunction of lumbar region: Secondary | ICD-10-CM | POA: Diagnosis not present

## 2018-04-23 DIAGNOSIS — M542 Cervicalgia: Secondary | ICD-10-CM | POA: Diagnosis not present

## 2018-04-23 DIAGNOSIS — M545 Low back pain: Secondary | ICD-10-CM | POA: Diagnosis not present

## 2018-04-23 DIAGNOSIS — M9901 Segmental and somatic dysfunction of cervical region: Secondary | ICD-10-CM | POA: Diagnosis not present

## 2018-05-28 DIAGNOSIS — M9903 Segmental and somatic dysfunction of lumbar region: Secondary | ICD-10-CM | POA: Diagnosis not present

## 2018-05-28 DIAGNOSIS — M542 Cervicalgia: Secondary | ICD-10-CM | POA: Diagnosis not present

## 2018-05-28 DIAGNOSIS — M9901 Segmental and somatic dysfunction of cervical region: Secondary | ICD-10-CM | POA: Diagnosis not present

## 2018-05-28 DIAGNOSIS — M545 Low back pain: Secondary | ICD-10-CM | POA: Diagnosis not present

## 2018-06-25 DIAGNOSIS — M9901 Segmental and somatic dysfunction of cervical region: Secondary | ICD-10-CM | POA: Diagnosis not present

## 2018-06-25 DIAGNOSIS — M545 Low back pain: Secondary | ICD-10-CM | POA: Diagnosis not present

## 2018-06-25 DIAGNOSIS — M9903 Segmental and somatic dysfunction of lumbar region: Secondary | ICD-10-CM | POA: Diagnosis not present

## 2018-06-25 DIAGNOSIS — M542 Cervicalgia: Secondary | ICD-10-CM | POA: Diagnosis not present

## 2018-07-01 DIAGNOSIS — Z6824 Body mass index (BMI) 24.0-24.9, adult: Secondary | ICD-10-CM | POA: Diagnosis not present

## 2018-07-01 DIAGNOSIS — Z01419 Encounter for gynecological examination (general) (routine) without abnormal findings: Secondary | ICD-10-CM | POA: Diagnosis not present

## 2018-07-23 DIAGNOSIS — M9902 Segmental and somatic dysfunction of thoracic region: Secondary | ICD-10-CM | POA: Diagnosis not present

## 2018-07-23 DIAGNOSIS — M9903 Segmental and somatic dysfunction of lumbar region: Secondary | ICD-10-CM | POA: Diagnosis not present

## 2018-07-23 DIAGNOSIS — M546 Pain in thoracic spine: Secondary | ICD-10-CM | POA: Diagnosis not present

## 2018-07-23 DIAGNOSIS — M545 Low back pain: Secondary | ICD-10-CM | POA: Diagnosis not present

## 2018-08-19 DIAGNOSIS — M9902 Segmental and somatic dysfunction of thoracic region: Secondary | ICD-10-CM | POA: Diagnosis not present

## 2018-08-19 DIAGNOSIS — M9903 Segmental and somatic dysfunction of lumbar region: Secondary | ICD-10-CM | POA: Diagnosis not present

## 2018-08-19 DIAGNOSIS — M545 Low back pain: Secondary | ICD-10-CM | POA: Diagnosis not present

## 2018-08-19 DIAGNOSIS — M546 Pain in thoracic spine: Secondary | ICD-10-CM | POA: Diagnosis not present

## 2018-09-17 DIAGNOSIS — M545 Low back pain: Secondary | ICD-10-CM | POA: Diagnosis not present

## 2018-09-17 DIAGNOSIS — M9901 Segmental and somatic dysfunction of cervical region: Secondary | ICD-10-CM | POA: Diagnosis not present

## 2018-09-17 DIAGNOSIS — M9903 Segmental and somatic dysfunction of lumbar region: Secondary | ICD-10-CM | POA: Diagnosis not present

## 2018-09-17 DIAGNOSIS — M542 Cervicalgia: Secondary | ICD-10-CM | POA: Diagnosis not present

## 2018-09-19 DIAGNOSIS — Z0000 Encounter for general adult medical examination without abnormal findings: Secondary | ICD-10-CM | POA: Diagnosis not present

## 2018-09-25 DIAGNOSIS — M25551 Pain in right hip: Secondary | ICD-10-CM | POA: Diagnosis not present

## 2018-09-25 DIAGNOSIS — Z0000 Encounter for general adult medical examination without abnormal findings: Secondary | ICD-10-CM | POA: Diagnosis not present

## 2018-09-25 DIAGNOSIS — M545 Low back pain: Secondary | ICD-10-CM | POA: Diagnosis not present

## 2018-10-15 DIAGNOSIS — M545 Low back pain: Secondary | ICD-10-CM | POA: Diagnosis not present

## 2018-10-15 DIAGNOSIS — M542 Cervicalgia: Secondary | ICD-10-CM | POA: Diagnosis not present

## 2018-10-15 DIAGNOSIS — M9903 Segmental and somatic dysfunction of lumbar region: Secondary | ICD-10-CM | POA: Diagnosis not present

## 2018-10-15 DIAGNOSIS — M9901 Segmental and somatic dysfunction of cervical region: Secondary | ICD-10-CM | POA: Diagnosis not present

## 2018-11-12 DIAGNOSIS — M9903 Segmental and somatic dysfunction of lumbar region: Secondary | ICD-10-CM | POA: Diagnosis not present

## 2018-11-12 DIAGNOSIS — M545 Low back pain: Secondary | ICD-10-CM | POA: Diagnosis not present

## 2018-11-12 DIAGNOSIS — M9901 Segmental and somatic dysfunction of cervical region: Secondary | ICD-10-CM | POA: Diagnosis not present

## 2018-11-12 DIAGNOSIS — M542 Cervicalgia: Secondary | ICD-10-CM | POA: Diagnosis not present

## 2018-12-10 DIAGNOSIS — M545 Low back pain: Secondary | ICD-10-CM | POA: Diagnosis not present

## 2018-12-10 DIAGNOSIS — M9903 Segmental and somatic dysfunction of lumbar region: Secondary | ICD-10-CM | POA: Diagnosis not present

## 2018-12-10 DIAGNOSIS — M9907 Segmental and somatic dysfunction of upper extremity: Secondary | ICD-10-CM | POA: Diagnosis not present

## 2018-12-10 DIAGNOSIS — M7542 Impingement syndrome of left shoulder: Secondary | ICD-10-CM | POA: Diagnosis not present

## 2018-12-30 DIAGNOSIS — Z3201 Encounter for pregnancy test, result positive: Secondary | ICD-10-CM | POA: Diagnosis not present

## 2019-01-07 DIAGNOSIS — M7542 Impingement syndrome of left shoulder: Secondary | ICD-10-CM | POA: Diagnosis not present

## 2019-01-07 DIAGNOSIS — M9907 Segmental and somatic dysfunction of upper extremity: Secondary | ICD-10-CM | POA: Diagnosis not present

## 2019-01-07 DIAGNOSIS — M545 Low back pain: Secondary | ICD-10-CM | POA: Diagnosis not present

## 2019-01-07 DIAGNOSIS — M9903 Segmental and somatic dysfunction of lumbar region: Secondary | ICD-10-CM | POA: Diagnosis not present

## 2019-01-20 DIAGNOSIS — Z3689 Encounter for other specified antenatal screening: Secondary | ICD-10-CM | POA: Diagnosis not present

## 2019-02-04 DIAGNOSIS — M9902 Segmental and somatic dysfunction of thoracic region: Secondary | ICD-10-CM | POA: Diagnosis not present

## 2019-02-04 DIAGNOSIS — M546 Pain in thoracic spine: Secondary | ICD-10-CM | POA: Diagnosis not present

## 2019-02-04 DIAGNOSIS — M545 Low back pain: Secondary | ICD-10-CM | POA: Diagnosis not present

## 2019-02-04 DIAGNOSIS — M9903 Segmental and somatic dysfunction of lumbar region: Secondary | ICD-10-CM | POA: Diagnosis not present

## 2019-02-19 DIAGNOSIS — Z3682 Encounter for antenatal screening for nuchal translucency: Secondary | ICD-10-CM | POA: Diagnosis not present

## 2019-02-25 DIAGNOSIS — M545 Low back pain: Secondary | ICD-10-CM | POA: Diagnosis not present

## 2019-02-25 DIAGNOSIS — M546 Pain in thoracic spine: Secondary | ICD-10-CM | POA: Diagnosis not present

## 2019-02-25 DIAGNOSIS — M9903 Segmental and somatic dysfunction of lumbar region: Secondary | ICD-10-CM | POA: Diagnosis not present

## 2019-02-25 DIAGNOSIS — M9902 Segmental and somatic dysfunction of thoracic region: Secondary | ICD-10-CM | POA: Diagnosis not present

## 2019-03-16 DIAGNOSIS — M546 Pain in thoracic spine: Secondary | ICD-10-CM | POA: Diagnosis not present

## 2019-03-16 DIAGNOSIS — M9902 Segmental and somatic dysfunction of thoracic region: Secondary | ICD-10-CM | POA: Diagnosis not present

## 2019-03-16 DIAGNOSIS — M545 Low back pain: Secondary | ICD-10-CM | POA: Diagnosis not present

## 2019-03-16 DIAGNOSIS — M9903 Segmental and somatic dysfunction of lumbar region: Secondary | ICD-10-CM | POA: Diagnosis not present

## 2019-03-19 DIAGNOSIS — Z3689 Encounter for other specified antenatal screening: Secondary | ICD-10-CM | POA: Diagnosis not present

## 2019-03-19 DIAGNOSIS — Z3A17 17 weeks gestation of pregnancy: Secondary | ICD-10-CM | POA: Diagnosis not present

## 2019-04-13 DIAGNOSIS — M9903 Segmental and somatic dysfunction of lumbar region: Secondary | ICD-10-CM | POA: Diagnosis not present

## 2019-04-13 DIAGNOSIS — M545 Low back pain: Secondary | ICD-10-CM | POA: Diagnosis not present

## 2019-04-13 DIAGNOSIS — M9901 Segmental and somatic dysfunction of cervical region: Secondary | ICD-10-CM | POA: Diagnosis not present

## 2019-04-13 DIAGNOSIS — M542 Cervicalgia: Secondary | ICD-10-CM | POA: Diagnosis not present

## 2019-04-14 DIAGNOSIS — Z23 Encounter for immunization: Secondary | ICD-10-CM | POA: Diagnosis not present

## 2019-04-17 DIAGNOSIS — N898 Other specified noninflammatory disorders of vagina: Secondary | ICD-10-CM | POA: Diagnosis not present

## 2019-04-17 DIAGNOSIS — Z3689 Encounter for other specified antenatal screening: Secondary | ICD-10-CM | POA: Diagnosis not present

## 2019-04-17 DIAGNOSIS — O26892 Other specified pregnancy related conditions, second trimester: Secondary | ICD-10-CM | POA: Diagnosis not present

## 2019-04-17 DIAGNOSIS — Z3A21 21 weeks gestation of pregnancy: Secondary | ICD-10-CM | POA: Diagnosis not present

## 2019-04-17 DIAGNOSIS — Z362 Encounter for other antenatal screening follow-up: Secondary | ICD-10-CM | POA: Diagnosis not present

## 2019-05-11 DIAGNOSIS — M9901 Segmental and somatic dysfunction of cervical region: Secondary | ICD-10-CM | POA: Diagnosis not present

## 2019-05-11 DIAGNOSIS — M9903 Segmental and somatic dysfunction of lumbar region: Secondary | ICD-10-CM | POA: Diagnosis not present

## 2019-05-11 DIAGNOSIS — M545 Low back pain: Secondary | ICD-10-CM | POA: Diagnosis not present

## 2019-05-11 DIAGNOSIS — M542 Cervicalgia: Secondary | ICD-10-CM | POA: Diagnosis not present

## 2019-06-01 DIAGNOSIS — M545 Low back pain: Secondary | ICD-10-CM | POA: Diagnosis not present

## 2019-06-01 DIAGNOSIS — M9902 Segmental and somatic dysfunction of thoracic region: Secondary | ICD-10-CM | POA: Diagnosis not present

## 2019-06-01 DIAGNOSIS — M9903 Segmental and somatic dysfunction of lumbar region: Secondary | ICD-10-CM | POA: Diagnosis not present

## 2019-06-01 DIAGNOSIS — M546 Pain in thoracic spine: Secondary | ICD-10-CM | POA: Diagnosis not present

## 2019-06-03 DIAGNOSIS — O26892 Other specified pregnancy related conditions, second trimester: Secondary | ICD-10-CM | POA: Diagnosis not present

## 2019-06-03 DIAGNOSIS — Z3689 Encounter for other specified antenatal screening: Secondary | ICD-10-CM | POA: Diagnosis not present

## 2019-06-03 DIAGNOSIS — Z6791 Unspecified blood type, Rh negative: Secondary | ICD-10-CM | POA: Diagnosis not present

## 2019-06-03 DIAGNOSIS — Z23 Encounter for immunization: Secondary | ICD-10-CM | POA: Diagnosis not present

## 2019-06-10 DIAGNOSIS — O99810 Abnormal glucose complicating pregnancy: Secondary | ICD-10-CM | POA: Diagnosis not present

## 2019-06-29 DIAGNOSIS — M546 Pain in thoracic spine: Secondary | ICD-10-CM | POA: Diagnosis not present

## 2019-06-29 DIAGNOSIS — M545 Low back pain: Secondary | ICD-10-CM | POA: Diagnosis not present

## 2019-06-29 DIAGNOSIS — M9902 Segmental and somatic dysfunction of thoracic region: Secondary | ICD-10-CM | POA: Diagnosis not present

## 2019-06-29 DIAGNOSIS — M9903 Segmental and somatic dysfunction of lumbar region: Secondary | ICD-10-CM | POA: Diagnosis not present

## 2019-07-20 DIAGNOSIS — M546 Pain in thoracic spine: Secondary | ICD-10-CM | POA: Diagnosis not present

## 2019-07-20 DIAGNOSIS — M9903 Segmental and somatic dysfunction of lumbar region: Secondary | ICD-10-CM | POA: Diagnosis not present

## 2019-07-20 DIAGNOSIS — M545 Low back pain: Secondary | ICD-10-CM | POA: Diagnosis not present

## 2019-07-20 DIAGNOSIS — M9902 Segmental and somatic dysfunction of thoracic region: Secondary | ICD-10-CM | POA: Diagnosis not present

## 2019-08-03 DIAGNOSIS — M9903 Segmental and somatic dysfunction of lumbar region: Secondary | ICD-10-CM | POA: Diagnosis not present

## 2019-08-03 DIAGNOSIS — M546 Pain in thoracic spine: Secondary | ICD-10-CM | POA: Diagnosis not present

## 2019-08-03 DIAGNOSIS — M545 Low back pain: Secondary | ICD-10-CM | POA: Diagnosis not present

## 2019-08-03 DIAGNOSIS — M9902 Segmental and somatic dysfunction of thoracic region: Secondary | ICD-10-CM | POA: Diagnosis not present

## 2019-08-03 DIAGNOSIS — Z3689 Encounter for other specified antenatal screening: Secondary | ICD-10-CM | POA: Diagnosis not present

## 2019-08-17 DIAGNOSIS — M545 Low back pain: Secondary | ICD-10-CM | POA: Diagnosis not present

## 2019-08-17 DIAGNOSIS — Z3A35 35 weeks gestation of pregnancy: Secondary | ICD-10-CM | POA: Diagnosis not present

## 2019-08-17 DIAGNOSIS — M9903 Segmental and somatic dysfunction of lumbar region: Secondary | ICD-10-CM | POA: Diagnosis not present

## 2019-08-17 DIAGNOSIS — M546 Pain in thoracic spine: Secondary | ICD-10-CM | POA: Diagnosis not present

## 2019-08-17 DIAGNOSIS — O24410 Gestational diabetes mellitus in pregnancy, diet controlled: Secondary | ICD-10-CM | POA: Diagnosis not present

## 2019-08-17 DIAGNOSIS — O24419 Gestational diabetes mellitus in pregnancy, unspecified control: Secondary | ICD-10-CM | POA: Diagnosis not present

## 2019-08-17 DIAGNOSIS — M9902 Segmental and somatic dysfunction of thoracic region: Secondary | ICD-10-CM | POA: Diagnosis not present

## 2019-08-19 DIAGNOSIS — Z20822 Contact with and (suspected) exposure to covid-19: Secondary | ICD-10-CM | POA: Diagnosis not present

## 2019-08-19 DIAGNOSIS — O24420 Gestational diabetes mellitus in childbirth, diet controlled: Secondary | ICD-10-CM | POA: Diagnosis not present

## 2019-08-19 DIAGNOSIS — K621 Rectal polyp: Secondary | ICD-10-CM | POA: Diagnosis not present

## 2019-08-19 DIAGNOSIS — O26893 Other specified pregnancy related conditions, third trimester: Secondary | ICD-10-CM | POA: Diagnosis not present

## 2019-08-19 DIAGNOSIS — Z3A38 38 weeks gestation of pregnancy: Secondary | ICD-10-CM | POA: Diagnosis not present

## 2019-08-19 DIAGNOSIS — O365930 Maternal care for other known or suspected poor fetal growth, third trimester, not applicable or unspecified: Secondary | ICD-10-CM | POA: Diagnosis not present

## 2019-08-19 DIAGNOSIS — Z6741 Type O blood, Rh negative: Secondary | ICD-10-CM | POA: Diagnosis not present

## 2019-08-20 DIAGNOSIS — K6289 Other specified diseases of anus and rectum: Secondary | ICD-10-CM | POA: Diagnosis not present

## 2019-09-14 DIAGNOSIS — M546 Pain in thoracic spine: Secondary | ICD-10-CM | POA: Diagnosis not present

## 2019-09-14 DIAGNOSIS — M9902 Segmental and somatic dysfunction of thoracic region: Secondary | ICD-10-CM | POA: Diagnosis not present

## 2019-09-14 DIAGNOSIS — M545 Low back pain: Secondary | ICD-10-CM | POA: Diagnosis not present

## 2019-09-14 DIAGNOSIS — M9903 Segmental and somatic dysfunction of lumbar region: Secondary | ICD-10-CM | POA: Diagnosis not present

## 2019-10-08 DIAGNOSIS — Z13220 Encounter for screening for lipoid disorders: Secondary | ICD-10-CM | POA: Diagnosis not present

## 2019-10-08 DIAGNOSIS — Z0000 Encounter for general adult medical examination without abnormal findings: Secondary | ICD-10-CM | POA: Diagnosis not present

## 2019-10-08 DIAGNOSIS — Z131 Encounter for screening for diabetes mellitus: Secondary | ICD-10-CM | POA: Diagnosis not present

## 2019-10-12 DIAGNOSIS — M9902 Segmental and somatic dysfunction of thoracic region: Secondary | ICD-10-CM | POA: Diagnosis not present

## 2019-10-12 DIAGNOSIS — M546 Pain in thoracic spine: Secondary | ICD-10-CM | POA: Diagnosis not present

## 2019-10-12 DIAGNOSIS — M9903 Segmental and somatic dysfunction of lumbar region: Secondary | ICD-10-CM | POA: Diagnosis not present

## 2019-10-12 DIAGNOSIS — M545 Low back pain: Secondary | ICD-10-CM | POA: Diagnosis not present

## 2019-10-15 DIAGNOSIS — Z0000 Encounter for general adult medical examination without abnormal findings: Secondary | ICD-10-CM | POA: Diagnosis not present

## 2019-11-09 DIAGNOSIS — M546 Pain in thoracic spine: Secondary | ICD-10-CM | POA: Diagnosis not present

## 2019-11-09 DIAGNOSIS — M9903 Segmental and somatic dysfunction of lumbar region: Secondary | ICD-10-CM | POA: Diagnosis not present

## 2019-11-09 DIAGNOSIS — M545 Low back pain: Secondary | ICD-10-CM | POA: Diagnosis not present

## 2019-11-09 DIAGNOSIS — M9902 Segmental and somatic dysfunction of thoracic region: Secondary | ICD-10-CM | POA: Diagnosis not present

## 2019-11-30 DIAGNOSIS — M546 Pain in thoracic spine: Secondary | ICD-10-CM | POA: Diagnosis not present

## 2019-11-30 DIAGNOSIS — M9902 Segmental and somatic dysfunction of thoracic region: Secondary | ICD-10-CM | POA: Diagnosis not present

## 2019-11-30 DIAGNOSIS — M9903 Segmental and somatic dysfunction of lumbar region: Secondary | ICD-10-CM | POA: Diagnosis not present

## 2019-11-30 DIAGNOSIS — M545 Low back pain: Secondary | ICD-10-CM | POA: Diagnosis not present

## 2019-12-21 DIAGNOSIS — M9903 Segmental and somatic dysfunction of lumbar region: Secondary | ICD-10-CM | POA: Diagnosis not present

## 2019-12-21 DIAGNOSIS — M9902 Segmental and somatic dysfunction of thoracic region: Secondary | ICD-10-CM | POA: Diagnosis not present

## 2019-12-21 DIAGNOSIS — M545 Low back pain: Secondary | ICD-10-CM | POA: Diagnosis not present

## 2019-12-21 DIAGNOSIS — M546 Pain in thoracic spine: Secondary | ICD-10-CM | POA: Diagnosis not present

## 2020-01-18 DIAGNOSIS — M546 Pain in thoracic spine: Secondary | ICD-10-CM | POA: Diagnosis not present

## 2020-01-18 DIAGNOSIS — M9902 Segmental and somatic dysfunction of thoracic region: Secondary | ICD-10-CM | POA: Diagnosis not present

## 2020-01-18 DIAGNOSIS — M9903 Segmental and somatic dysfunction of lumbar region: Secondary | ICD-10-CM | POA: Diagnosis not present

## 2020-01-18 DIAGNOSIS — M545 Low back pain: Secondary | ICD-10-CM | POA: Diagnosis not present

## 2020-02-09 DIAGNOSIS — M5441 Lumbago with sciatica, right side: Secondary | ICD-10-CM | POA: Diagnosis not present

## 2020-02-09 DIAGNOSIS — M545 Low back pain: Secondary | ICD-10-CM | POA: Diagnosis not present

## 2020-02-09 DIAGNOSIS — M48061 Spinal stenosis, lumbar region without neurogenic claudication: Secondary | ICD-10-CM | POA: Diagnosis not present

## 2020-02-24 DIAGNOSIS — M9903 Segmental and somatic dysfunction of lumbar region: Secondary | ICD-10-CM | POA: Diagnosis not present

## 2020-02-24 DIAGNOSIS — M545 Low back pain: Secondary | ICD-10-CM | POA: Diagnosis not present

## 2020-02-24 DIAGNOSIS — M546 Pain in thoracic spine: Secondary | ICD-10-CM | POA: Diagnosis not present

## 2020-02-24 DIAGNOSIS — M9902 Segmental and somatic dysfunction of thoracic region: Secondary | ICD-10-CM | POA: Diagnosis not present

## 2020-03-08 DIAGNOSIS — M6281 Muscle weakness (generalized): Secondary | ICD-10-CM | POA: Diagnosis not present

## 2020-03-08 DIAGNOSIS — G8929 Other chronic pain: Secondary | ICD-10-CM | POA: Diagnosis not present

## 2020-03-08 DIAGNOSIS — R2689 Other abnormalities of gait and mobility: Secondary | ICD-10-CM | POA: Diagnosis not present

## 2020-03-08 DIAGNOSIS — M5441 Lumbago with sciatica, right side: Secondary | ICD-10-CM | POA: Diagnosis not present

## 2020-03-15 DIAGNOSIS — G8929 Other chronic pain: Secondary | ICD-10-CM | POA: Diagnosis not present

## 2020-03-15 DIAGNOSIS — R2689 Other abnormalities of gait and mobility: Secondary | ICD-10-CM | POA: Diagnosis not present

## 2020-03-15 DIAGNOSIS — M6281 Muscle weakness (generalized): Secondary | ICD-10-CM | POA: Diagnosis not present

## 2020-03-15 DIAGNOSIS — M5441 Lumbago with sciatica, right side: Secondary | ICD-10-CM | POA: Diagnosis not present

## 2020-03-17 DIAGNOSIS — M6281 Muscle weakness (generalized): Secondary | ICD-10-CM | POA: Diagnosis not present

## 2020-03-17 DIAGNOSIS — R2689 Other abnormalities of gait and mobility: Secondary | ICD-10-CM | POA: Diagnosis not present

## 2020-03-17 DIAGNOSIS — M5441 Lumbago with sciatica, right side: Secondary | ICD-10-CM | POA: Diagnosis not present

## 2020-03-17 DIAGNOSIS — G8929 Other chronic pain: Secondary | ICD-10-CM | POA: Diagnosis not present

## 2020-03-22 DIAGNOSIS — R2689 Other abnormalities of gait and mobility: Secondary | ICD-10-CM | POA: Diagnosis not present

## 2020-03-22 DIAGNOSIS — M9902 Segmental and somatic dysfunction of thoracic region: Secondary | ICD-10-CM | POA: Diagnosis not present

## 2020-03-22 DIAGNOSIS — M546 Pain in thoracic spine: Secondary | ICD-10-CM | POA: Diagnosis not present

## 2020-03-22 DIAGNOSIS — M9903 Segmental and somatic dysfunction of lumbar region: Secondary | ICD-10-CM | POA: Diagnosis not present

## 2020-03-22 DIAGNOSIS — M545 Low back pain: Secondary | ICD-10-CM | POA: Diagnosis not present

## 2020-03-22 DIAGNOSIS — G8929 Other chronic pain: Secondary | ICD-10-CM | POA: Diagnosis not present

## 2020-03-22 DIAGNOSIS — M6281 Muscle weakness (generalized): Secondary | ICD-10-CM | POA: Diagnosis not present

## 2020-03-22 DIAGNOSIS — M5441 Lumbago with sciatica, right side: Secondary | ICD-10-CM | POA: Diagnosis not present

## 2020-03-23 DIAGNOSIS — M6281 Muscle weakness (generalized): Secondary | ICD-10-CM | POA: Diagnosis not present

## 2020-03-23 DIAGNOSIS — G8929 Other chronic pain: Secondary | ICD-10-CM | POA: Diagnosis not present

## 2020-03-23 DIAGNOSIS — M5441 Lumbago with sciatica, right side: Secondary | ICD-10-CM | POA: Diagnosis not present

## 2020-03-23 DIAGNOSIS — R2689 Other abnormalities of gait and mobility: Secondary | ICD-10-CM | POA: Diagnosis not present

## 2020-04-18 DIAGNOSIS — M546 Pain in thoracic spine: Secondary | ICD-10-CM | POA: Diagnosis not present

## 2020-04-18 DIAGNOSIS — M9902 Segmental and somatic dysfunction of thoracic region: Secondary | ICD-10-CM | POA: Diagnosis not present

## 2020-04-18 DIAGNOSIS — M542 Cervicalgia: Secondary | ICD-10-CM | POA: Diagnosis not present

## 2020-04-18 DIAGNOSIS — M9901 Segmental and somatic dysfunction of cervical region: Secondary | ICD-10-CM | POA: Diagnosis not present

## 2020-05-20 DIAGNOSIS — M542 Cervicalgia: Secondary | ICD-10-CM | POA: Diagnosis not present

## 2020-05-20 DIAGNOSIS — M546 Pain in thoracic spine: Secondary | ICD-10-CM | POA: Diagnosis not present

## 2020-05-20 DIAGNOSIS — M9901 Segmental and somatic dysfunction of cervical region: Secondary | ICD-10-CM | POA: Diagnosis not present

## 2020-05-20 DIAGNOSIS — M9902 Segmental and somatic dysfunction of thoracic region: Secondary | ICD-10-CM | POA: Diagnosis not present

## 2020-06-19 DIAGNOSIS — J029 Acute pharyngitis, unspecified: Secondary | ICD-10-CM | POA: Diagnosis not present

## 2020-06-19 DIAGNOSIS — Z6823 Body mass index (BMI) 23.0-23.9, adult: Secondary | ICD-10-CM | POA: Diagnosis not present

## 2020-08-19 DIAGNOSIS — M9902 Segmental and somatic dysfunction of thoracic region: Secondary | ICD-10-CM | POA: Diagnosis not present

## 2020-08-19 DIAGNOSIS — M546 Pain in thoracic spine: Secondary | ICD-10-CM | POA: Diagnosis not present

## 2020-08-19 DIAGNOSIS — M542 Cervicalgia: Secondary | ICD-10-CM | POA: Diagnosis not present

## 2020-08-19 DIAGNOSIS — M9901 Segmental and somatic dysfunction of cervical region: Secondary | ICD-10-CM | POA: Diagnosis not present

## 2020-09-16 DIAGNOSIS — M9902 Segmental and somatic dysfunction of thoracic region: Secondary | ICD-10-CM | POA: Diagnosis not present

## 2020-09-16 DIAGNOSIS — M542 Cervicalgia: Secondary | ICD-10-CM | POA: Diagnosis not present

## 2020-09-16 DIAGNOSIS — M9901 Segmental and somatic dysfunction of cervical region: Secondary | ICD-10-CM | POA: Diagnosis not present

## 2020-09-16 DIAGNOSIS — M546 Pain in thoracic spine: Secondary | ICD-10-CM | POA: Diagnosis not present

## 2020-10-18 DIAGNOSIS — Z13220 Encounter for screening for lipoid disorders: Secondary | ICD-10-CM | POA: Diagnosis not present

## 2020-10-18 DIAGNOSIS — Z0000 Encounter for general adult medical examination without abnormal findings: Secondary | ICD-10-CM | POA: Diagnosis not present

## 2020-10-18 DIAGNOSIS — Z131 Encounter for screening for diabetes mellitus: Secondary | ICD-10-CM | POA: Diagnosis not present

## 2020-10-24 DIAGNOSIS — M9903 Segmental and somatic dysfunction of lumbar region: Secondary | ICD-10-CM | POA: Diagnosis not present

## 2020-10-24 DIAGNOSIS — M9902 Segmental and somatic dysfunction of thoracic region: Secondary | ICD-10-CM | POA: Diagnosis not present

## 2020-10-24 DIAGNOSIS — M546 Pain in thoracic spine: Secondary | ICD-10-CM | POA: Diagnosis not present

## 2020-10-24 DIAGNOSIS — M5442 Lumbago with sciatica, left side: Secondary | ICD-10-CM | POA: Diagnosis not present

## 2020-10-31 DIAGNOSIS — Z01419 Encounter for gynecological examination (general) (routine) without abnormal findings: Secondary | ICD-10-CM | POA: Diagnosis not present

## 2020-10-31 DIAGNOSIS — Z0000 Encounter for general adult medical examination without abnormal findings: Secondary | ICD-10-CM | POA: Diagnosis not present

## 2020-10-31 DIAGNOSIS — M5416 Radiculopathy, lumbar region: Secondary | ICD-10-CM | POA: Diagnosis not present

## 2020-10-31 DIAGNOSIS — Z6822 Body mass index (BMI) 22.0-22.9, adult: Secondary | ICD-10-CM | POA: Diagnosis not present

## 2020-11-01 ENCOUNTER — Other Ambulatory Visit: Payer: Self-pay | Admitting: Internal Medicine

## 2020-11-01 DIAGNOSIS — M5416 Radiculopathy, lumbar region: Secondary | ICD-10-CM

## 2020-11-09 ENCOUNTER — Other Ambulatory Visit: Payer: Self-pay

## 2020-11-09 ENCOUNTER — Ambulatory Visit
Admission: RE | Admit: 2020-11-09 | Discharge: 2020-11-09 | Disposition: A | Discharge status: Home / Self Care | Payer: Federal, State, Local not specified - PPO | Source: Ambulatory Visit | Attending: Internal Medicine | Admitting: Internal Medicine

## 2020-11-09 DIAGNOSIS — M545 Low back pain, unspecified: Secondary | ICD-10-CM | POA: Diagnosis not present

## 2020-11-09 DIAGNOSIS — M5416 Radiculopathy, lumbar region: Secondary | ICD-10-CM

## 2020-11-18 DIAGNOSIS — M9903 Segmental and somatic dysfunction of lumbar region: Secondary | ICD-10-CM | POA: Diagnosis not present

## 2020-11-18 DIAGNOSIS — M546 Pain in thoracic spine: Secondary | ICD-10-CM | POA: Diagnosis not present

## 2020-11-18 DIAGNOSIS — M9902 Segmental and somatic dysfunction of thoracic region: Secondary | ICD-10-CM | POA: Diagnosis not present

## 2020-11-18 DIAGNOSIS — M5442 Lumbago with sciatica, left side: Secondary | ICD-10-CM | POA: Diagnosis not present

## 2020-12-14 DIAGNOSIS — M5442 Lumbago with sciatica, left side: Secondary | ICD-10-CM | POA: Diagnosis not present

## 2020-12-14 DIAGNOSIS — M9903 Segmental and somatic dysfunction of lumbar region: Secondary | ICD-10-CM | POA: Diagnosis not present

## 2020-12-14 DIAGNOSIS — M9902 Segmental and somatic dysfunction of thoracic region: Secondary | ICD-10-CM | POA: Diagnosis not present

## 2020-12-14 DIAGNOSIS — M546 Pain in thoracic spine: Secondary | ICD-10-CM | POA: Diagnosis not present

## 2021-01-05 DIAGNOSIS — M25551 Pain in right hip: Secondary | ICD-10-CM | POA: Diagnosis not present

## 2021-01-11 DIAGNOSIS — M5442 Lumbago with sciatica, left side: Secondary | ICD-10-CM | POA: Diagnosis not present

## 2021-01-11 DIAGNOSIS — M9903 Segmental and somatic dysfunction of lumbar region: Secondary | ICD-10-CM | POA: Diagnosis not present

## 2021-01-11 DIAGNOSIS — M546 Pain in thoracic spine: Secondary | ICD-10-CM | POA: Diagnosis not present

## 2021-01-11 DIAGNOSIS — M9902 Segmental and somatic dysfunction of thoracic region: Secondary | ICD-10-CM | POA: Diagnosis not present

## 2021-01-28 DIAGNOSIS — M25551 Pain in right hip: Secondary | ICD-10-CM | POA: Diagnosis not present

## 2021-02-08 DIAGNOSIS — M9902 Segmental and somatic dysfunction of thoracic region: Secondary | ICD-10-CM | POA: Diagnosis not present

## 2021-02-08 DIAGNOSIS — M5442 Lumbago with sciatica, left side: Secondary | ICD-10-CM | POA: Diagnosis not present

## 2021-02-08 DIAGNOSIS — M546 Pain in thoracic spine: Secondary | ICD-10-CM | POA: Diagnosis not present

## 2021-02-08 DIAGNOSIS — M9903 Segmental and somatic dysfunction of lumbar region: Secondary | ICD-10-CM | POA: Diagnosis not present

## 2021-02-22 DIAGNOSIS — M25551 Pain in right hip: Secondary | ICD-10-CM | POA: Diagnosis not present

## 2021-02-22 DIAGNOSIS — M545 Low back pain, unspecified: Secondary | ICD-10-CM | POA: Diagnosis not present

## 2021-03-08 DIAGNOSIS — M9903 Segmental and somatic dysfunction of lumbar region: Secondary | ICD-10-CM | POA: Diagnosis not present

## 2021-03-08 DIAGNOSIS — M546 Pain in thoracic spine: Secondary | ICD-10-CM | POA: Diagnosis not present

## 2021-03-08 DIAGNOSIS — M9902 Segmental and somatic dysfunction of thoracic region: Secondary | ICD-10-CM | POA: Diagnosis not present

## 2021-03-08 DIAGNOSIS — M5442 Lumbago with sciatica, left side: Secondary | ICD-10-CM | POA: Diagnosis not present

## 2021-03-17 DIAGNOSIS — M533 Sacrococcygeal disorders, not elsewhere classified: Secondary | ICD-10-CM | POA: Diagnosis not present

## 2021-04-01 DIAGNOSIS — R102 Pelvic and perineal pain: Secondary | ICD-10-CM | POA: Diagnosis not present

## 2021-04-06 DIAGNOSIS — M533 Sacrococcygeal disorders, not elsewhere classified: Secondary | ICD-10-CM | POA: Diagnosis not present

## 2021-04-07 DIAGNOSIS — M9903 Segmental and somatic dysfunction of lumbar region: Secondary | ICD-10-CM | POA: Diagnosis not present

## 2021-04-07 DIAGNOSIS — M5442 Lumbago with sciatica, left side: Secondary | ICD-10-CM | POA: Diagnosis not present

## 2021-04-07 DIAGNOSIS — M546 Pain in thoracic spine: Secondary | ICD-10-CM | POA: Diagnosis not present

## 2021-04-07 DIAGNOSIS — M9902 Segmental and somatic dysfunction of thoracic region: Secondary | ICD-10-CM | POA: Diagnosis not present

## 2021-04-12 DIAGNOSIS — Z6823 Body mass index (BMI) 23.0-23.9, adult: Secondary | ICD-10-CM | POA: Diagnosis not present

## 2021-04-12 DIAGNOSIS — N938 Other specified abnormal uterine and vaginal bleeding: Secondary | ICD-10-CM | POA: Diagnosis not present

## 2021-04-26 DIAGNOSIS — M533 Sacrococcygeal disorders, not elsewhere classified: Secondary | ICD-10-CM | POA: Diagnosis not present

## 2021-05-05 DIAGNOSIS — M5442 Lumbago with sciatica, left side: Secondary | ICD-10-CM | POA: Diagnosis not present

## 2021-05-05 DIAGNOSIS — M9903 Segmental and somatic dysfunction of lumbar region: Secondary | ICD-10-CM | POA: Diagnosis not present

## 2021-05-05 DIAGNOSIS — M546 Pain in thoracic spine: Secondary | ICD-10-CM | POA: Diagnosis not present

## 2021-05-05 DIAGNOSIS — M9902 Segmental and somatic dysfunction of thoracic region: Secondary | ICD-10-CM | POA: Diagnosis not present

## 2021-05-30 DIAGNOSIS — M25551 Pain in right hip: Secondary | ICD-10-CM | POA: Diagnosis not present

## 2021-05-30 DIAGNOSIS — M25552 Pain in left hip: Secondary | ICD-10-CM | POA: Diagnosis not present

## 2021-05-30 DIAGNOSIS — M533 Sacrococcygeal disorders, not elsewhere classified: Secondary | ICD-10-CM | POA: Diagnosis not present

## 2021-06-02 DIAGNOSIS — M5442 Lumbago with sciatica, left side: Secondary | ICD-10-CM | POA: Diagnosis not present

## 2021-06-02 DIAGNOSIS — M9903 Segmental and somatic dysfunction of lumbar region: Secondary | ICD-10-CM | POA: Diagnosis not present

## 2021-06-02 DIAGNOSIS — M9902 Segmental and somatic dysfunction of thoracic region: Secondary | ICD-10-CM | POA: Diagnosis not present

## 2021-06-02 DIAGNOSIS — M546 Pain in thoracic spine: Secondary | ICD-10-CM | POA: Diagnosis not present

## 2021-06-27 DIAGNOSIS — M25552 Pain in left hip: Secondary | ICD-10-CM | POA: Diagnosis not present

## 2021-06-27 DIAGNOSIS — M25551 Pain in right hip: Secondary | ICD-10-CM | POA: Diagnosis not present

## 2021-07-07 DIAGNOSIS — M546 Pain in thoracic spine: Secondary | ICD-10-CM | POA: Diagnosis not present

## 2021-07-07 DIAGNOSIS — M9902 Segmental and somatic dysfunction of thoracic region: Secondary | ICD-10-CM | POA: Diagnosis not present

## 2021-07-07 DIAGNOSIS — M9903 Segmental and somatic dysfunction of lumbar region: Secondary | ICD-10-CM | POA: Diagnosis not present

## 2021-07-07 DIAGNOSIS — M5442 Lumbago with sciatica, left side: Secondary | ICD-10-CM | POA: Diagnosis not present

## 2021-07-11 DIAGNOSIS — M25552 Pain in left hip: Secondary | ICD-10-CM | POA: Diagnosis not present

## 2021-07-11 DIAGNOSIS — M25551 Pain in right hip: Secondary | ICD-10-CM | POA: Diagnosis not present

## 2021-08-01 DIAGNOSIS — M545 Low back pain, unspecified: Secondary | ICD-10-CM | POA: Diagnosis not present

## 2021-08-01 DIAGNOSIS — M25551 Pain in right hip: Secondary | ICD-10-CM | POA: Diagnosis not present

## 2021-08-01 DIAGNOSIS — M533 Sacrococcygeal disorders, not elsewhere classified: Secondary | ICD-10-CM | POA: Diagnosis not present

## 2021-08-07 DIAGNOSIS — M9903 Segmental and somatic dysfunction of lumbar region: Secondary | ICD-10-CM | POA: Diagnosis not present

## 2021-08-07 DIAGNOSIS — M546 Pain in thoracic spine: Secondary | ICD-10-CM | POA: Diagnosis not present

## 2021-08-07 DIAGNOSIS — M9902 Segmental and somatic dysfunction of thoracic region: Secondary | ICD-10-CM | POA: Diagnosis not present

## 2021-08-07 DIAGNOSIS — M5442 Lumbago with sciatica, left side: Secondary | ICD-10-CM | POA: Diagnosis not present

## 2021-08-21 DIAGNOSIS — M5451 Vertebrogenic low back pain: Secondary | ICD-10-CM | POA: Diagnosis not present

## 2021-09-04 DIAGNOSIS — M9903 Segmental and somatic dysfunction of lumbar region: Secondary | ICD-10-CM | POA: Diagnosis not present

## 2021-09-04 DIAGNOSIS — M9902 Segmental and somatic dysfunction of thoracic region: Secondary | ICD-10-CM | POA: Diagnosis not present

## 2021-09-04 DIAGNOSIS — M546 Pain in thoracic spine: Secondary | ICD-10-CM | POA: Diagnosis not present

## 2021-09-04 DIAGNOSIS — M5442 Lumbago with sciatica, left side: Secondary | ICD-10-CM | POA: Diagnosis not present

## 2021-10-09 DIAGNOSIS — M9902 Segmental and somatic dysfunction of thoracic region: Secondary | ICD-10-CM | POA: Diagnosis not present

## 2021-10-09 DIAGNOSIS — M5442 Lumbago with sciatica, left side: Secondary | ICD-10-CM | POA: Diagnosis not present

## 2021-10-09 DIAGNOSIS — M546 Pain in thoracic spine: Secondary | ICD-10-CM | POA: Diagnosis not present

## 2021-10-09 DIAGNOSIS — M9903 Segmental and somatic dysfunction of lumbar region: Secondary | ICD-10-CM | POA: Diagnosis not present

## 2021-10-11 DIAGNOSIS — F411 Generalized anxiety disorder: Secondary | ICD-10-CM | POA: Diagnosis not present

## 2021-10-11 DIAGNOSIS — F331 Major depressive disorder, recurrent, moderate: Secondary | ICD-10-CM | POA: Diagnosis not present

## 2021-10-17 DIAGNOSIS — F411 Generalized anxiety disorder: Secondary | ICD-10-CM | POA: Diagnosis not present

## 2021-10-17 DIAGNOSIS — F331 Major depressive disorder, recurrent, moderate: Secondary | ICD-10-CM | POA: Diagnosis not present

## 2021-10-26 DIAGNOSIS — F411 Generalized anxiety disorder: Secondary | ICD-10-CM | POA: Diagnosis not present

## 2021-10-26 DIAGNOSIS — F331 Major depressive disorder, recurrent, moderate: Secondary | ICD-10-CM | POA: Diagnosis not present

## 2021-10-31 DIAGNOSIS — F331 Major depressive disorder, recurrent, moderate: Secondary | ICD-10-CM | POA: Diagnosis not present

## 2021-10-31 DIAGNOSIS — F411 Generalized anxiety disorder: Secondary | ICD-10-CM | POA: Diagnosis not present

## 2021-11-02 DIAGNOSIS — Z6823 Body mass index (BMI) 23.0-23.9, adult: Secondary | ICD-10-CM | POA: Diagnosis not present

## 2021-11-02 DIAGNOSIS — Z0000 Encounter for general adult medical examination without abnormal findings: Secondary | ICD-10-CM | POA: Diagnosis not present

## 2021-11-06 DIAGNOSIS — M5442 Lumbago with sciatica, left side: Secondary | ICD-10-CM | POA: Diagnosis not present

## 2021-11-06 DIAGNOSIS — M9902 Segmental and somatic dysfunction of thoracic region: Secondary | ICD-10-CM | POA: Diagnosis not present

## 2021-11-06 DIAGNOSIS — M546 Pain in thoracic spine: Secondary | ICD-10-CM | POA: Diagnosis not present

## 2021-11-06 DIAGNOSIS — M9903 Segmental and somatic dysfunction of lumbar region: Secondary | ICD-10-CM | POA: Diagnosis not present

## 2021-11-14 DIAGNOSIS — F411 Generalized anxiety disorder: Secondary | ICD-10-CM | POA: Diagnosis not present

## 2021-11-14 DIAGNOSIS — F331 Major depressive disorder, recurrent, moderate: Secondary | ICD-10-CM | POA: Diagnosis not present

## 2021-11-20 DIAGNOSIS — G5601 Carpal tunnel syndrome, right upper limb: Secondary | ICD-10-CM | POA: Diagnosis not present

## 2021-11-20 DIAGNOSIS — H6983 Other specified disorders of Eustachian tube, bilateral: Secondary | ICD-10-CM | POA: Diagnosis not present

## 2021-11-20 DIAGNOSIS — Z0000 Encounter for general adult medical examination without abnormal findings: Secondary | ICD-10-CM | POA: Diagnosis not present

## 2021-11-20 DIAGNOSIS — M25552 Pain in left hip: Secondary | ICD-10-CM | POA: Diagnosis not present

## 2021-11-20 DIAGNOSIS — M25551 Pain in right hip: Secondary | ICD-10-CM | POA: Diagnosis not present

## 2021-11-23 DIAGNOSIS — F411 Generalized anxiety disorder: Secondary | ICD-10-CM | POA: Diagnosis not present

## 2021-11-23 DIAGNOSIS — F331 Major depressive disorder, recurrent, moderate: Secondary | ICD-10-CM | POA: Diagnosis not present

## 2021-11-28 DIAGNOSIS — F411 Generalized anxiety disorder: Secondary | ICD-10-CM | POA: Diagnosis not present

## 2021-11-28 DIAGNOSIS — F331 Major depressive disorder, recurrent, moderate: Secondary | ICD-10-CM | POA: Diagnosis not present

## 2021-12-01 DIAGNOSIS — M5442 Lumbago with sciatica, left side: Secondary | ICD-10-CM | POA: Diagnosis not present

## 2021-12-01 DIAGNOSIS — M9903 Segmental and somatic dysfunction of lumbar region: Secondary | ICD-10-CM | POA: Diagnosis not present

## 2021-12-01 DIAGNOSIS — M9902 Segmental and somatic dysfunction of thoracic region: Secondary | ICD-10-CM | POA: Diagnosis not present

## 2021-12-01 DIAGNOSIS — M546 Pain in thoracic spine: Secondary | ICD-10-CM | POA: Diagnosis not present

## 2021-12-05 DIAGNOSIS — F331 Major depressive disorder, recurrent, moderate: Secondary | ICD-10-CM | POA: Diagnosis not present

## 2021-12-05 DIAGNOSIS — F411 Generalized anxiety disorder: Secondary | ICD-10-CM | POA: Diagnosis not present

## 2021-12-12 DIAGNOSIS — F331 Major depressive disorder, recurrent, moderate: Secondary | ICD-10-CM | POA: Diagnosis not present

## 2021-12-12 DIAGNOSIS — F411 Generalized anxiety disorder: Secondary | ICD-10-CM | POA: Diagnosis not present

## 2021-12-18 DIAGNOSIS — F331 Major depressive disorder, recurrent, moderate: Secondary | ICD-10-CM | POA: Diagnosis not present

## 2021-12-18 DIAGNOSIS — F411 Generalized anxiety disorder: Secondary | ICD-10-CM | POA: Diagnosis not present

## 2021-12-27 DIAGNOSIS — M9903 Segmental and somatic dysfunction of lumbar region: Secondary | ICD-10-CM | POA: Diagnosis not present

## 2021-12-27 DIAGNOSIS — M546 Pain in thoracic spine: Secondary | ICD-10-CM | POA: Diagnosis not present

## 2021-12-27 DIAGNOSIS — M9902 Segmental and somatic dysfunction of thoracic region: Secondary | ICD-10-CM | POA: Diagnosis not present

## 2021-12-27 DIAGNOSIS — M5442 Lumbago with sciatica, left side: Secondary | ICD-10-CM | POA: Diagnosis not present

## 2022-01-08 DIAGNOSIS — M25551 Pain in right hip: Secondary | ICD-10-CM | POA: Diagnosis not present

## 2022-01-08 DIAGNOSIS — M25552 Pain in left hip: Secondary | ICD-10-CM | POA: Diagnosis not present

## 2022-01-12 DIAGNOSIS — F411 Generalized anxiety disorder: Secondary | ICD-10-CM | POA: Diagnosis not present

## 2022-01-12 DIAGNOSIS — F331 Major depressive disorder, recurrent, moderate: Secondary | ICD-10-CM | POA: Diagnosis not present

## 2022-01-23 DIAGNOSIS — F411 Generalized anxiety disorder: Secondary | ICD-10-CM | POA: Diagnosis not present

## 2022-01-23 DIAGNOSIS — F331 Major depressive disorder, recurrent, moderate: Secondary | ICD-10-CM | POA: Diagnosis not present

## 2022-01-24 DIAGNOSIS — M546 Pain in thoracic spine: Secondary | ICD-10-CM | POA: Diagnosis not present

## 2022-01-24 DIAGNOSIS — M9903 Segmental and somatic dysfunction of lumbar region: Secondary | ICD-10-CM | POA: Diagnosis not present

## 2022-01-24 DIAGNOSIS — M5442 Lumbago with sciatica, left side: Secondary | ICD-10-CM | POA: Diagnosis not present

## 2022-01-24 DIAGNOSIS — M9902 Segmental and somatic dysfunction of thoracic region: Secondary | ICD-10-CM | POA: Diagnosis not present

## 2022-01-30 DIAGNOSIS — F411 Generalized anxiety disorder: Secondary | ICD-10-CM | POA: Diagnosis not present

## 2022-01-30 DIAGNOSIS — F331 Major depressive disorder, recurrent, moderate: Secondary | ICD-10-CM | POA: Diagnosis not present

## 2022-02-06 DIAGNOSIS — F331 Major depressive disorder, recurrent, moderate: Secondary | ICD-10-CM | POA: Diagnosis not present

## 2022-02-06 DIAGNOSIS — F411 Generalized anxiety disorder: Secondary | ICD-10-CM | POA: Diagnosis not present

## 2022-02-10 DIAGNOSIS — M25551 Pain in right hip: Secondary | ICD-10-CM | POA: Diagnosis not present

## 2022-02-15 DIAGNOSIS — M25551 Pain in right hip: Secondary | ICD-10-CM | POA: Diagnosis not present

## 2022-02-15 DIAGNOSIS — M76891 Other specified enthesopathies of right lower limb, excluding foot: Secondary | ICD-10-CM | POA: Diagnosis not present

## 2022-02-20 DIAGNOSIS — F331 Major depressive disorder, recurrent, moderate: Secondary | ICD-10-CM | POA: Diagnosis not present

## 2022-02-20 DIAGNOSIS — F411 Generalized anxiety disorder: Secondary | ICD-10-CM | POA: Diagnosis not present

## 2022-02-21 DIAGNOSIS — M546 Pain in thoracic spine: Secondary | ICD-10-CM | POA: Diagnosis not present

## 2022-02-21 DIAGNOSIS — M9902 Segmental and somatic dysfunction of thoracic region: Secondary | ICD-10-CM | POA: Diagnosis not present

## 2022-02-21 DIAGNOSIS — M9903 Segmental and somatic dysfunction of lumbar region: Secondary | ICD-10-CM | POA: Diagnosis not present

## 2022-02-21 DIAGNOSIS — M5442 Lumbago with sciatica, left side: Secondary | ICD-10-CM | POA: Diagnosis not present

## 2022-02-27 DIAGNOSIS — F411 Generalized anxiety disorder: Secondary | ICD-10-CM | POA: Diagnosis not present

## 2022-02-27 DIAGNOSIS — F331 Major depressive disorder, recurrent, moderate: Secondary | ICD-10-CM | POA: Diagnosis not present

## 2022-03-06 DIAGNOSIS — F331 Major depressive disorder, recurrent, moderate: Secondary | ICD-10-CM | POA: Diagnosis not present

## 2022-03-06 DIAGNOSIS — F411 Generalized anxiety disorder: Secondary | ICD-10-CM | POA: Diagnosis not present

## 2022-03-13 DIAGNOSIS — F411 Generalized anxiety disorder: Secondary | ICD-10-CM | POA: Diagnosis not present

## 2022-03-13 DIAGNOSIS — F331 Major depressive disorder, recurrent, moderate: Secondary | ICD-10-CM | POA: Diagnosis not present

## 2022-03-20 DIAGNOSIS — F331 Major depressive disorder, recurrent, moderate: Secondary | ICD-10-CM | POA: Diagnosis not present

## 2022-03-20 DIAGNOSIS — F411 Generalized anxiety disorder: Secondary | ICD-10-CM | POA: Diagnosis not present

## 2022-03-21 DIAGNOSIS — N6311 Unspecified lump in the right breast, upper outer quadrant: Secondary | ICD-10-CM | POA: Diagnosis not present

## 2022-03-21 DIAGNOSIS — N6312 Unspecified lump in the right breast, upper inner quadrant: Secondary | ICD-10-CM | POA: Diagnosis not present

## 2022-03-21 DIAGNOSIS — M9903 Segmental and somatic dysfunction of lumbar region: Secondary | ICD-10-CM | POA: Diagnosis not present

## 2022-03-21 DIAGNOSIS — Z803 Family history of malignant neoplasm of breast: Secondary | ICD-10-CM | POA: Diagnosis not present

## 2022-03-21 DIAGNOSIS — M9902 Segmental and somatic dysfunction of thoracic region: Secondary | ICD-10-CM | POA: Diagnosis not present

## 2022-03-21 DIAGNOSIS — N644 Mastodynia: Secondary | ICD-10-CM | POA: Diagnosis not present

## 2022-03-21 DIAGNOSIS — M546 Pain in thoracic spine: Secondary | ICD-10-CM | POA: Diagnosis not present

## 2022-03-21 DIAGNOSIS — R922 Inconclusive mammogram: Secondary | ICD-10-CM | POA: Diagnosis not present

## 2022-03-21 DIAGNOSIS — M5442 Lumbago with sciatica, left side: Secondary | ICD-10-CM | POA: Diagnosis not present

## 2022-03-27 DIAGNOSIS — F411 Generalized anxiety disorder: Secondary | ICD-10-CM | POA: Diagnosis not present

## 2022-03-27 DIAGNOSIS — F331 Major depressive disorder, recurrent, moderate: Secondary | ICD-10-CM | POA: Diagnosis not present

## 2022-04-03 DIAGNOSIS — M25351 Other instability, right hip: Secondary | ICD-10-CM | POA: Diagnosis not present

## 2022-04-03 DIAGNOSIS — F331 Major depressive disorder, recurrent, moderate: Secondary | ICD-10-CM | POA: Diagnosis not present

## 2022-04-03 DIAGNOSIS — F411 Generalized anxiety disorder: Secondary | ICD-10-CM | POA: Diagnosis not present

## 2022-04-03 DIAGNOSIS — M76891 Other specified enthesopathies of right lower limb, excluding foot: Secondary | ICD-10-CM | POA: Diagnosis not present

## 2022-04-03 DIAGNOSIS — M25851 Other specified joint disorders, right hip: Secondary | ICD-10-CM | POA: Diagnosis not present

## 2022-04-10 DIAGNOSIS — F331 Major depressive disorder, recurrent, moderate: Secondary | ICD-10-CM | POA: Diagnosis not present

## 2022-04-10 DIAGNOSIS — F411 Generalized anxiety disorder: Secondary | ICD-10-CM | POA: Diagnosis not present

## 2022-04-23 DIAGNOSIS — M5442 Lumbago with sciatica, left side: Secondary | ICD-10-CM | POA: Diagnosis not present

## 2022-04-23 DIAGNOSIS — M546 Pain in thoracic spine: Secondary | ICD-10-CM | POA: Diagnosis not present

## 2022-04-23 DIAGNOSIS — M9902 Segmental and somatic dysfunction of thoracic region: Secondary | ICD-10-CM | POA: Diagnosis not present

## 2022-04-23 DIAGNOSIS — M9903 Segmental and somatic dysfunction of lumbar region: Secondary | ICD-10-CM | POA: Diagnosis not present

## 2022-04-24 DIAGNOSIS — F411 Generalized anxiety disorder: Secondary | ICD-10-CM | POA: Diagnosis not present

## 2022-04-24 DIAGNOSIS — F331 Major depressive disorder, recurrent, moderate: Secondary | ICD-10-CM | POA: Diagnosis not present

## 2022-05-01 DIAGNOSIS — F411 Generalized anxiety disorder: Secondary | ICD-10-CM | POA: Diagnosis not present

## 2022-05-01 DIAGNOSIS — F331 Major depressive disorder, recurrent, moderate: Secondary | ICD-10-CM | POA: Diagnosis not present

## 2022-05-08 DIAGNOSIS — F411 Generalized anxiety disorder: Secondary | ICD-10-CM | POA: Diagnosis not present

## 2022-05-08 DIAGNOSIS — F331 Major depressive disorder, recurrent, moderate: Secondary | ICD-10-CM | POA: Diagnosis not present

## 2022-05-09 DIAGNOSIS — J029 Acute pharyngitis, unspecified: Secondary | ICD-10-CM | POA: Diagnosis not present

## 2022-05-09 DIAGNOSIS — H9203 Otalgia, bilateral: Secondary | ICD-10-CM | POA: Diagnosis not present

## 2022-05-15 DIAGNOSIS — F411 Generalized anxiety disorder: Secondary | ICD-10-CM | POA: Diagnosis not present

## 2022-05-15 DIAGNOSIS — F331 Major depressive disorder, recurrent, moderate: Secondary | ICD-10-CM | POA: Diagnosis not present

## 2022-05-25 DIAGNOSIS — F331 Major depressive disorder, recurrent, moderate: Secondary | ICD-10-CM | POA: Diagnosis not present

## 2022-05-25 DIAGNOSIS — M5442 Lumbago with sciatica, left side: Secondary | ICD-10-CM | POA: Diagnosis not present

## 2022-05-25 DIAGNOSIS — M9902 Segmental and somatic dysfunction of thoracic region: Secondary | ICD-10-CM | POA: Diagnosis not present

## 2022-05-25 DIAGNOSIS — F411 Generalized anxiety disorder: Secondary | ICD-10-CM | POA: Diagnosis not present

## 2022-05-25 DIAGNOSIS — M546 Pain in thoracic spine: Secondary | ICD-10-CM | POA: Diagnosis not present

## 2022-05-25 DIAGNOSIS — M9903 Segmental and somatic dysfunction of lumbar region: Secondary | ICD-10-CM | POA: Diagnosis not present

## 2022-05-29 DIAGNOSIS — M533 Sacrococcygeal disorders, not elsewhere classified: Secondary | ICD-10-CM | POA: Diagnosis not present

## 2022-05-29 DIAGNOSIS — M545 Low back pain, unspecified: Secondary | ICD-10-CM | POA: Diagnosis not present

## 2022-05-29 DIAGNOSIS — F331 Major depressive disorder, recurrent, moderate: Secondary | ICD-10-CM | POA: Diagnosis not present

## 2022-05-29 DIAGNOSIS — F411 Generalized anxiety disorder: Secondary | ICD-10-CM | POA: Diagnosis not present

## 2022-05-29 DIAGNOSIS — M25551 Pain in right hip: Secondary | ICD-10-CM | POA: Diagnosis not present

## 2022-06-05 DIAGNOSIS — F331 Major depressive disorder, recurrent, moderate: Secondary | ICD-10-CM | POA: Diagnosis not present

## 2022-06-05 DIAGNOSIS — F411 Generalized anxiety disorder: Secondary | ICD-10-CM | POA: Diagnosis not present

## 2022-06-19 DIAGNOSIS — F411 Generalized anxiety disorder: Secondary | ICD-10-CM | POA: Diagnosis not present

## 2022-06-19 DIAGNOSIS — F331 Major depressive disorder, recurrent, moderate: Secondary | ICD-10-CM | POA: Diagnosis not present

## 2022-06-22 DIAGNOSIS — M9902 Segmental and somatic dysfunction of thoracic region: Secondary | ICD-10-CM | POA: Diagnosis not present

## 2022-06-22 DIAGNOSIS — M9903 Segmental and somatic dysfunction of lumbar region: Secondary | ICD-10-CM | POA: Diagnosis not present

## 2022-06-22 DIAGNOSIS — M546 Pain in thoracic spine: Secondary | ICD-10-CM | POA: Diagnosis not present

## 2022-06-22 DIAGNOSIS — M5442 Lumbago with sciatica, left side: Secondary | ICD-10-CM | POA: Diagnosis not present

## 2022-06-26 DIAGNOSIS — F411 Generalized anxiety disorder: Secondary | ICD-10-CM | POA: Diagnosis not present

## 2022-06-26 DIAGNOSIS — F331 Major depressive disorder, recurrent, moderate: Secondary | ICD-10-CM | POA: Diagnosis not present

## 2022-07-03 DIAGNOSIS — F331 Major depressive disorder, recurrent, moderate: Secondary | ICD-10-CM | POA: Diagnosis not present

## 2022-07-03 DIAGNOSIS — F411 Generalized anxiety disorder: Secondary | ICD-10-CM | POA: Diagnosis not present

## 2022-07-04 DIAGNOSIS — M533 Sacrococcygeal disorders, not elsewhere classified: Secondary | ICD-10-CM | POA: Diagnosis not present

## 2022-07-17 DIAGNOSIS — F411 Generalized anxiety disorder: Secondary | ICD-10-CM | POA: Diagnosis not present

## 2022-07-17 DIAGNOSIS — F331 Major depressive disorder, recurrent, moderate: Secondary | ICD-10-CM | POA: Diagnosis not present

## 2022-07-24 DIAGNOSIS — F331 Major depressive disorder, recurrent, moderate: Secondary | ICD-10-CM | POA: Diagnosis not present

## 2022-07-24 DIAGNOSIS — F411 Generalized anxiety disorder: Secondary | ICD-10-CM | POA: Diagnosis not present

## 2022-07-30 DIAGNOSIS — M9903 Segmental and somatic dysfunction of lumbar region: Secondary | ICD-10-CM | POA: Diagnosis not present

## 2022-07-30 DIAGNOSIS — M9902 Segmental and somatic dysfunction of thoracic region: Secondary | ICD-10-CM | POA: Diagnosis not present

## 2022-07-30 DIAGNOSIS — M546 Pain in thoracic spine: Secondary | ICD-10-CM | POA: Diagnosis not present

## 2022-07-30 DIAGNOSIS — M5442 Lumbago with sciatica, left side: Secondary | ICD-10-CM | POA: Diagnosis not present

## 2022-08-08 DIAGNOSIS — F411 Generalized anxiety disorder: Secondary | ICD-10-CM | POA: Diagnosis not present

## 2022-08-08 DIAGNOSIS — F331 Major depressive disorder, recurrent, moderate: Secondary | ICD-10-CM | POA: Diagnosis not present

## 2022-08-15 DIAGNOSIS — F331 Major depressive disorder, recurrent, moderate: Secondary | ICD-10-CM | POA: Diagnosis not present

## 2022-08-15 DIAGNOSIS — F411 Generalized anxiety disorder: Secondary | ICD-10-CM | POA: Diagnosis not present

## 2022-08-27 DIAGNOSIS — M5442 Lumbago with sciatica, left side: Secondary | ICD-10-CM | POA: Diagnosis not present

## 2022-08-27 DIAGNOSIS — M9902 Segmental and somatic dysfunction of thoracic region: Secondary | ICD-10-CM | POA: Diagnosis not present

## 2022-08-27 DIAGNOSIS — M546 Pain in thoracic spine: Secondary | ICD-10-CM | POA: Diagnosis not present

## 2022-08-27 DIAGNOSIS — M9903 Segmental and somatic dysfunction of lumbar region: Secondary | ICD-10-CM | POA: Diagnosis not present

## 2022-08-29 DIAGNOSIS — F331 Major depressive disorder, recurrent, moderate: Secondary | ICD-10-CM | POA: Diagnosis not present

## 2022-08-29 DIAGNOSIS — F411 Generalized anxiety disorder: Secondary | ICD-10-CM | POA: Diagnosis not present

## 2022-09-05 DIAGNOSIS — F331 Major depressive disorder, recurrent, moderate: Secondary | ICD-10-CM | POA: Diagnosis not present

## 2022-09-05 DIAGNOSIS — F411 Generalized anxiety disorder: Secondary | ICD-10-CM | POA: Diagnosis not present

## 2022-09-12 DIAGNOSIS — F411 Generalized anxiety disorder: Secondary | ICD-10-CM | POA: Diagnosis not present

## 2022-09-12 DIAGNOSIS — F331 Major depressive disorder, recurrent, moderate: Secondary | ICD-10-CM | POA: Diagnosis not present

## 2022-09-19 DIAGNOSIS — F411 Generalized anxiety disorder: Secondary | ICD-10-CM | POA: Diagnosis not present

## 2022-09-19 DIAGNOSIS — F331 Major depressive disorder, recurrent, moderate: Secondary | ICD-10-CM | POA: Diagnosis not present

## 2022-09-20 DIAGNOSIS — H9311 Tinnitus, right ear: Secondary | ICD-10-CM | POA: Diagnosis not present

## 2022-09-20 DIAGNOSIS — H93291 Other abnormal auditory perceptions, right ear: Secondary | ICD-10-CM | POA: Diagnosis not present

## 2022-09-28 DIAGNOSIS — M9902 Segmental and somatic dysfunction of thoracic region: Secondary | ICD-10-CM | POA: Diagnosis not present

## 2022-09-28 DIAGNOSIS — M546 Pain in thoracic spine: Secondary | ICD-10-CM | POA: Diagnosis not present

## 2022-09-28 DIAGNOSIS — M9903 Segmental and somatic dysfunction of lumbar region: Secondary | ICD-10-CM | POA: Diagnosis not present

## 2022-09-28 DIAGNOSIS — M5442 Lumbago with sciatica, left side: Secondary | ICD-10-CM | POA: Diagnosis not present

## 2022-10-03 DIAGNOSIS — F411 Generalized anxiety disorder: Secondary | ICD-10-CM | POA: Diagnosis not present

## 2022-10-03 DIAGNOSIS — F331 Major depressive disorder, recurrent, moderate: Secondary | ICD-10-CM | POA: Diagnosis not present

## 2022-10-24 DIAGNOSIS — F331 Major depressive disorder, recurrent, moderate: Secondary | ICD-10-CM | POA: Diagnosis not present

## 2022-10-24 DIAGNOSIS — F411 Generalized anxiety disorder: Secondary | ICD-10-CM | POA: Diagnosis not present

## 2022-10-26 DIAGNOSIS — M5442 Lumbago with sciatica, left side: Secondary | ICD-10-CM | POA: Diagnosis not present

## 2022-10-26 DIAGNOSIS — M546 Pain in thoracic spine: Secondary | ICD-10-CM | POA: Diagnosis not present

## 2022-10-26 DIAGNOSIS — M9903 Segmental and somatic dysfunction of lumbar region: Secondary | ICD-10-CM | POA: Diagnosis not present

## 2022-10-26 DIAGNOSIS — M9902 Segmental and somatic dysfunction of thoracic region: Secondary | ICD-10-CM | POA: Diagnosis not present

## 2022-10-31 DIAGNOSIS — F331 Major depressive disorder, recurrent, moderate: Secondary | ICD-10-CM | POA: Diagnosis not present

## 2022-10-31 DIAGNOSIS — F411 Generalized anxiety disorder: Secondary | ICD-10-CM | POA: Diagnosis not present

## 2022-11-07 DIAGNOSIS — F411 Generalized anxiety disorder: Secondary | ICD-10-CM | POA: Diagnosis not present

## 2022-11-07 DIAGNOSIS — F331 Major depressive disorder, recurrent, moderate: Secondary | ICD-10-CM | POA: Diagnosis not present

## 2022-11-19 DIAGNOSIS — Z0000 Encounter for general adult medical examination without abnormal findings: Secondary | ICD-10-CM | POA: Diagnosis not present

## 2022-11-21 DIAGNOSIS — F4310 Post-traumatic stress disorder, unspecified: Secondary | ICD-10-CM | POA: Diagnosis not present

## 2022-11-26 DIAGNOSIS — M9902 Segmental and somatic dysfunction of thoracic region: Secondary | ICD-10-CM | POA: Diagnosis not present

## 2022-11-26 DIAGNOSIS — M9903 Segmental and somatic dysfunction of lumbar region: Secondary | ICD-10-CM | POA: Diagnosis not present

## 2022-11-26 DIAGNOSIS — M5442 Lumbago with sciatica, left side: Secondary | ICD-10-CM | POA: Diagnosis not present

## 2022-11-26 DIAGNOSIS — M25551 Pain in right hip: Secondary | ICD-10-CM | POA: Diagnosis not present

## 2022-11-26 DIAGNOSIS — Z0000 Encounter for general adult medical examination without abnormal findings: Secondary | ICD-10-CM | POA: Diagnosis not present

## 2022-11-26 DIAGNOSIS — M546 Pain in thoracic spine: Secondary | ICD-10-CM | POA: Diagnosis not present

## 2022-11-28 DIAGNOSIS — F4310 Post-traumatic stress disorder, unspecified: Secondary | ICD-10-CM | POA: Diagnosis not present

## 2022-12-05 DIAGNOSIS — F4310 Post-traumatic stress disorder, unspecified: Secondary | ICD-10-CM | POA: Diagnosis not present

## 2022-12-05 DIAGNOSIS — Z1339 Encounter for screening examination for other mental health and behavioral disorders: Secondary | ICD-10-CM | POA: Diagnosis not present

## 2022-12-11 ENCOUNTER — Encounter (HOSPITAL_BASED_OUTPATIENT_CLINIC_OR_DEPARTMENT_OTHER): Payer: Self-pay | Admitting: Emergency Medicine

## 2022-12-11 ENCOUNTER — Emergency Department (HOSPITAL_BASED_OUTPATIENT_CLINIC_OR_DEPARTMENT_OTHER)
Admission: EM | Admit: 2022-12-11 | Discharge: 2022-12-11 | Disposition: A | Discharge status: Home / Self Care | Payer: Federal, State, Local not specified - PPO | Attending: Emergency Medicine | Admitting: Emergency Medicine

## 2022-12-11 DIAGNOSIS — S0003XA Contusion of scalp, initial encounter: Secondary | ICD-10-CM | POA: Diagnosis not present

## 2022-12-11 DIAGNOSIS — W1830XA Fall on same level, unspecified, initial encounter: Secondary | ICD-10-CM | POA: Insufficient documentation

## 2022-12-11 DIAGNOSIS — S0990XA Unspecified injury of head, initial encounter: Secondary | ICD-10-CM | POA: Diagnosis not present

## 2022-12-11 NOTE — Discharge Instructions (Signed)
Apply ice to help with the swelling and discomfort.  Take over the counter medications such as Tylenol ibuprofen.  Return to the ER if you start having trouble with severe headache, confusion, vomiting

## 2022-12-11 NOTE — ED Triage Notes (Signed)
Pt arrives pov, steady gait, reports "waffle maker fell on my head" Sunday night. Denies loc. Pt c/o head tenderness to top of head and reports pressure behind RT eye with rapid movement of eyes.

## 2022-12-11 NOTE — ED Provider Notes (Signed)
[place] [facility] AT [facility] Provider Note   CSN: [id number] Arrival date & time: 12/11/22  1024     History  Chief Complaint  Patient presents with   Head Injury    Misty Mendoza is a 37 y.o. female.   Head Injury    Patient presents to the ED for evaluation after a head injury.  Patient states Sunday night she had a waffle maker fall on her head.  Patient has a notable area of swelling at the top of her scalp.  Patient states she has had intermittent headaches since then.  She has noted some discomfort when looking certain directions.  She is not having any vomiting.  No confusion.  No trouble with her balance or coordination.  She mentioned her symptoms at work today and coworkers thought she should come and get checked out to make sure she does not have a concussion  Home Medications Prior to Admission medications   Medication Sig Start Date End Date Taking? Authorizing Provider  dicyclomine (BENTYL) 20 MG tablet Take 1 tablet (20 mg total) by mouth every 6 (six) hours as needed for spasms. 05/10/16   Iva Boop, MD  Norgestimate-Ethinyl Estradiol Triphasic (TRINESSA, 28,) 0.18/0.215/0.25 MG-35 MCG tablet Take 1 tablet by mouth daily.    [provider]      Allergies    Patient has no known allergies.    Review of Systems   Review of Systems  Physical Exam Updated Vital Signs BP 131/81   Pulse 87   Temp 98.1 F (36.7 C) (Oral)   Resp 16   Ht 1.549 m (5\' 1" )   Wt 53.5 kg   LMP 11/25/2022   SpO2 100%   BMI 22.30 kg/m  Physical Exam Vitals and nursing note reviewed.  Constitutional:      General: She is not in acute distress.    Appearance: She is well-developed.  HENT:     Head: Normocephalic.     Comments: Focal area of swelling noted to the scalp, mild tenderness palpation, no bony step-off    Right Ear: External ear normal.     Left Ear: External ear normal.  Eyes:     General: No scleral icterus.       Right  eye: No discharge.        Left eye: No discharge.     Conjunctiva/sclera: Conjunctivae normal.  Neck:     Trachea: No tracheal deviation.  Cardiovascular:     Rate and Rhythm: Normal rate.  Pulmonary:     Effort: Pulmonary effort is normal. No respiratory distress.     Breath sounds: No stridor.  Abdominal:     General: There is no distension.  Musculoskeletal:        General: No swelling or deformity.     Cervical back: Neck supple.  Skin:    General: Skin is warm and dry.     Findings: No rash.  Neurological:     General: No focal deficit present.     Mental Status: She is alert. Mental status is at baseline.     Cranial Nerves: No cranial nerve deficit, dysarthria or facial asymmetry.     Sensory: No sensory deficit.     Motor: No weakness or seizure activity.     Comments: Normal gait     ED Results / Procedures / Treatments   Labs (all labs ordered are listed, but only abnormal results are displayed) Labs Reviewed - No data  to display  EKG None  Radiology No results found.  Procedures Procedures    Medications Ordered in ED Medications - No data to display  ED Course/ Medical Decision Making/ A&P                             Medical Decision Making  Patient presents ED for evaluation of a headache or head injury.  Patient did not experience any loss of consciousness.  She has not had any vomiting.  She is not having any balance trouble or confusion.  I have low suspicion for serious head injury and do not feel that CT scan imaging is indicated.  I discussed this with the patient and she agrees.  Explained to her cannot completely exclude a concussion but this would not change treatment.  Overall I suspect she is having pain associated with her contusion and not actually concussion.  Discussed returning to the ER for CT scan imaging if she has its severe headache vomiting or other worsening symptoms.  Appropriate for outpatient management at this time with ice  and NSAIDs as needed.        Final Clinical Impression(s) / ED Diagnoses Final diagnoses:  Contusion of scalp, initial encounter    Rx / DC Orders ED Discharge Orders     None         Linwood Dibbles, MD 12/11/22 1104

## 2022-12-11 NOTE — ED Notes (Signed)
Patient verbalizes understanding of discharge instructions. Opportunity for questioning and answers were provided. Patient discharged from ED.  °

## 2022-12-12 DIAGNOSIS — F4310 Post-traumatic stress disorder, unspecified: Secondary | ICD-10-CM | POA: Diagnosis not present

## 2022-12-19 DIAGNOSIS — F4310 Post-traumatic stress disorder, unspecified: Secondary | ICD-10-CM | POA: Diagnosis not present

## 2022-12-24 DIAGNOSIS — M9902 Segmental and somatic dysfunction of thoracic region: Secondary | ICD-10-CM | POA: Diagnosis not present

## 2022-12-24 DIAGNOSIS — M9903 Segmental and somatic dysfunction of lumbar region: Secondary | ICD-10-CM | POA: Diagnosis not present

## 2022-12-24 DIAGNOSIS — M5442 Lumbago with sciatica, left side: Secondary | ICD-10-CM | POA: Diagnosis not present

## 2022-12-24 DIAGNOSIS — M546 Pain in thoracic spine: Secondary | ICD-10-CM | POA: Diagnosis not present

## 2022-12-25 DIAGNOSIS — Z803 Family history of malignant neoplasm of breast: Secondary | ICD-10-CM | POA: Diagnosis not present

## 2022-12-25 DIAGNOSIS — Z1151 Encounter for screening for human papillomavirus (HPV): Secondary | ICD-10-CM | POA: Diagnosis not present

## 2022-12-25 DIAGNOSIS — Z01419 Encounter for gynecological examination (general) (routine) without abnormal findings: Secondary | ICD-10-CM | POA: Diagnosis not present

## 2022-12-26 DIAGNOSIS — F4310 Post-traumatic stress disorder, unspecified: Secondary | ICD-10-CM | POA: Diagnosis not present

## 2023-01-09 DIAGNOSIS — F4310 Post-traumatic stress disorder, unspecified: Secondary | ICD-10-CM | POA: Diagnosis not present

## 2023-01-21 DIAGNOSIS — M5442 Lumbago with sciatica, left side: Secondary | ICD-10-CM | POA: Diagnosis not present

## 2023-01-21 DIAGNOSIS — M546 Pain in thoracic spine: Secondary | ICD-10-CM | POA: Diagnosis not present

## 2023-01-21 DIAGNOSIS — M9902 Segmental and somatic dysfunction of thoracic region: Secondary | ICD-10-CM | POA: Diagnosis not present

## 2023-01-21 DIAGNOSIS — M9903 Segmental and somatic dysfunction of lumbar region: Secondary | ICD-10-CM | POA: Diagnosis not present

## 2023-01-23 DIAGNOSIS — F411 Generalized anxiety disorder: Secondary | ICD-10-CM | POA: Diagnosis not present

## 2023-01-23 DIAGNOSIS — F331 Major depressive disorder, recurrent, moderate: Secondary | ICD-10-CM | POA: Diagnosis not present

## 2023-02-11 ENCOUNTER — Encounter: Payer: Self-pay | Admitting: Neurology

## 2023-02-11 DIAGNOSIS — H6993 Unspecified Eustachian tube disorder, bilateral: Secondary | ICD-10-CM | POA: Insufficient documentation

## 2023-02-11 DIAGNOSIS — M5416 Radiculopathy, lumbar region: Secondary | ICD-10-CM | POA: Insufficient documentation

## 2023-02-11 DIAGNOSIS — M25552 Pain in left hip: Secondary | ICD-10-CM | POA: Insufficient documentation

## 2023-02-11 DIAGNOSIS — M25551 Pain in right hip: Secondary | ICD-10-CM | POA: Insufficient documentation

## 2023-02-11 DIAGNOSIS — G5601 Carpal tunnel syndrome, right upper limb: Secondary | ICD-10-CM | POA: Insufficient documentation

## 2023-02-12 ENCOUNTER — Ambulatory Visit: Payer: Federal, State, Local not specified - PPO | Admitting: Neurology

## 2023-02-12 ENCOUNTER — Telehealth: Payer: Self-pay | Admitting: Neurology

## 2023-02-12 ENCOUNTER — Encounter: Payer: Self-pay | Admitting: Neurology

## 2023-02-12 VITALS — BP 128/68 | HR 107 | Ht 61.0 in | Wt 120.0 lb

## 2023-02-12 DIAGNOSIS — M25551 Pain in right hip: Secondary | ICD-10-CM

## 2023-02-12 DIAGNOSIS — M25552 Pain in left hip: Secondary | ICD-10-CM

## 2023-02-12 DIAGNOSIS — M5416 Radiculopathy, lumbar region: Secondary | ICD-10-CM

## 2023-02-12 DIAGNOSIS — H93A1 Pulsatile tinnitus, right ear: Secondary | ICD-10-CM | POA: Diagnosis not present

## 2023-02-12 DIAGNOSIS — H6993 Unspecified Eustachian tube disorder, bilateral: Secondary | ICD-10-CM

## 2023-02-12 DIAGNOSIS — G5601 Carpal tunnel syndrome, right upper limb: Secondary | ICD-10-CM

## 2023-02-12 NOTE — Progress Notes (Signed)
[facility]  PATIENT: Misty Mendoza DOB: 1985/10/09  REQUESTING CLINICIAN: Georgianne Fick, MD HISTORY FROM: Patient  REASON FOR VISIT: Constant Pulsatile tinnitus    HISTORICAL  CHIEF COMPLAINT:  Chief Complaint  Patient presents with   New Patient (Initial Visit)    Rm 13, alone, been having ringing/"whooshing" in right ear, going on since 09/2021 and it's not constant but is frequent. Has seen ENT, ruled out blocked eustation tube    HISTORY OF PRESENT ILLNESS:  This 37 year old woman past medical history of chronic hip pain, who is presenting with complaint of pulsatile tinnitus in the right ear for the past year.  Patient reports since March 2023 she has been experiencing constant pulsatile tinnitus that she described as "beach waves sound".  This is constant.  She has seen ENT and rule out eustachian tube dysfunction. She denies any hearing loss, denies any headaches, denies any change in her vision.  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  With her military service, she denies any history of concussion, denies any history of neck trauma.  No other complaints.    OTHER MEDICAL CONDITIONS: Pulsatile tinnitus, chronic right hip pain   REVIEW OF SYSTEMS: Full 14 system review of systems performed and negative with exception of: As noted in the HPI   ALLERGIES: No Known Allergies  HOME MEDICATIONS: Outpatient Medications Prior to Visit  Medication Sig Dispense Refill   erythromycin with ethanol (EMGEL) 2 % gel Apply 1 application  topically.     HYDROcodone-acetaminophen (NORCO/VICODIN) 5-325 MG tablet Take 1 tablet by mouth every 8 (eight) hours as needed.     ibuprofen (ADVIL) 600 MG tablet Take 600 mg by mouth 3 (three) times daily.     Levonorgestrel-Ethinyl Estrad (VIENVA PO) Take 1 tablet by mouth  daily.     dicyclomine (BENTYL) 20 MG tablet Take 1 tablet (20 mg total) by mouth every 6 (six) hours as needed for spasms. (Patient not taking: Reported on 02/12/2023) 90 tablet 0   ESTARYLLA 0.25-35 MG-MCG tablet Take 1 tablet by mouth daily. (Patient not taking: Reported on 02/12/2023)     Norgestimate-Ethinyl Estradiol Triphasic (TRINESSA, 28,) 0.18/0.215/0.25 MG-35 MCG tablet Take 1 tablet by mouth daily. (Patient not taking: Reported on 02/12/2023)     Facility-Administered Medications Prior to Visit  Medication Dose Route Frequency Provider Last Rate Last Admin   0.9 %  sodium chloride infusion  500 mL Intravenous Continuous Iva Boop, MD        PAST MEDICAL HISTORY: Past Medical History:  Diagnosis Date   Arthralgia    right hip   Gastritis    GERD (gastroesophageal reflux disease)    Lumbar radiculopathy, right    Sciatica of right side    Thyroid enlargement    right- pt unsure about this dx 05-25-16    PAST SURGICAL HISTORY: Past Surgical History:  Procedure Laterality Date   laproscopic endometrious removal Left    right hip arthroscopy  10/05/2009    FAMILY HISTORY: Family History  Problem Relation Age of Onset   Breast cancer Mother    Heart attack Father        in his 70's    Heart disease Father    Colon cancer Maternal Aunt    Colon cancer Maternal Grandmother    Esophageal cancer Neg Hx  Rectal cancer Neg Hx    Stomach cancer Neg Hx     SOCIAL HISTORY: Social History   Socioeconomic History   Marital status: Married    Spouse name: Not on file   Number of children: 2   Years of education: Not on file   Highest education level: Not on file  Occupational History   Occupation: HR  Tobacco Use   Smoking status: Former   Smokeless tobacco: Never  Vaping Use   Vaping status: Every Day   Substances: Nicotine, Flavoring  Substance and Sexual Activity   Alcohol use: Yes    Comment: rarely- social events   Drug use: No   Sexual activity:  Yes    Birth control/protection: Pill  Other Topics Concern   Not on file  Social History Narrative   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 She's married with one daughter. One caffeinated beverage daily no alcohol.   05/10/2016   Right handed   Social Determinants of Health   Financial Resource Strain: Low Risk  (05/29/2022)   Received [facility], [facility]   Overall Financial Resource Strain (CARDIA)    Difficulty of Paying Living Expenses: Not hard at all  Food Insecurity: No Food Insecurity (05/29/2022)   Received [facility], [facility]   Hunger Vital Sign    Worried About Running Out of Food in the Last Year: Never true    Ran Out of Food in the Last Year: Never true  Transportation Needs: No Transportation Needs (05/29/2022)   Received from [facility], [facility]   [facility] - Transportation    In the past 12 months, has lack of transportation kept you from medical appointments or from getting medications?: No    Lack of Transportation (Non-Medical): No  Physical Activity: Inactive (12/25/2022)   [facility] from [facility]   Exercise Vital Sign    Days of Exercise per Week: 0 days    Minutes of Exercise per Session: 0 min  Stress: No Stress Concern Present (03/19/2019)   Received from [facility], [facility] - Occupational Stress Questionnaire    Feeling of Stress : Not at all  Social Connections: Unknown (03/19/2019)   Received from [facility], [facility]   Social Connection and Isolation Panel [NHANES]    Frequency of Communication with Friends and Family: Not on file    Frequency of Social Gatherings with Friends and Family: Not on file    Attends Religious Services: Not on file    Active Member of Clubs or Organizations: Not on file    Attends Banker  Meetings: Never    Marital Status: Married  Catering manager Violence: Not At Risk (12/25/2022)   Received from [facility]   Humiliation, Afraid, Rape, and Kick questionnaire    Fear of Current or Ex-Partner: No    Emotionally Abused: No    Physically Abused: No    Sexually Abused: No    PHYSICAL EXAM  GENERAL EXAM/CONSTITUTIONAL: Vitals:  Vitals:   02/12/23 0800  BP: 128/68  Pulse: (!) 107  SpO2: 99%  Weight: 120 lb (54.4 kg)  Height: 5\' 1"  (1.549 m)   Body mass index is 22.67 kg/m. Wt Readings from Last 3 Encounters:  02/12/23 120 lb (54.4 kg)  12/11/22 118 lb (53.5 kg)  05/25/16 132 lb (  59.9 kg)   Patient is in no distress; well developed, nourished and groomed; neck is supple  MUSCULOSKELETAL: Gait, strength, tone, movements noted in Neurologic exam below  NEUROLOGIC: MENTAL STATUS:      No data to display         awake, alert, oriented to person, place and time recent and remote memory intact normal attention and concentration language fluent, comprehension intact, naming intact fund of knowledge appropriate  CRANIAL NERVE:  2nd, 3rd, 4th, 6th - Visual fields full to confrontation, extraocular muscles intact, no nystagmus 5th - facial sensation symmetric 7th - facial strength symmetric 8th - hearing intact 9th - palate elevates symmetrically, uvula midline 11th - shoulder shrug symmetric 12th - tongue protrusion midline  MOTOR:  normal bulk and tone, full strength in the BUE, BLE  SENSORY:  normal and symmetric to light touch  COORDINATION:  finger-nose-finger, fine finger movements normal  GAIT/STATION:  normal     DIAGNOSTIC DATA (LABS, IMAGING, TESTING) - I reviewed patient records, labs, notes, testing and imaging myself where available.  Lab Results  Component Value Date   WBC 6.7 12/30/2008   HGB 13.6 12/30/2008   HCT 36.4 12/30/2008   MCV 89.5 12/30/2008   PLT 185 12/30/2008      Component Value Date/Time   NA 138  12/30/2008 1400   K 3.7 12/30/2008 1400   CL 106 12/30/2008 1400   CO2 26 12/30/2008 1400   GLUCOSE 112 (H) 12/30/2008 1400   BUN 10 12/30/2008 1400   CREATININE 0.71 12/30/2008 1400   CALCIUM 9.0 12/30/2008 1400   GFRNONAA >60 12/30/2008 1400   GFRAA  12/30/2008 1400    >60        The eGFR has been calculated using the MDRD equation. This calculation has not been validated in all clinical situations. eGFR's persistently <60 mL/min signify possible Chronic Kidney Disease.   No results found for: "CHOL", "HDL", "LDLCALC", "LDLDIRECT", "TRIG", "CHOLHDL" No results found for: "HGBA1C" No results found for: "VITAMINB12" No results found for: "TSH"    ASSESSMENT AND PLAN  37 y.o. year old female with history of right hip pain who is presenting with constant pulsatile tinnitus for the past year.  Denies any hearing loss, denies any pain.  Sometimes she feels like she can hear her heartbeat with the tinnitus.  Plan is to rule out vascular malformation by obtaining a CT angiogram head and neck.  I will contact the patient to go over the results.  Continue to follow-up with PCP and return if worse or any other concerns.    1. Pulsatile tinnitus of right ear      Patient Instructions  CT angiogram head and neck, I will contact you to go over the results Continue follow-up PCP Return as needed.  Orders Placed This Encounter  Procedures   CT ANGIO HEAD W OR WO CONTRAST   CT ANGIO NECK W OR WO CONTRAST    No orders of the defined types were placed in this encounter.   Return if symptoms worsen or fail to improve.    Windell Norfolk, MD 02/12/2023, 8:27 AM  [facility] [address] [place], [place] [address] [phone]

## 2023-02-12 NOTE — Telephone Encounter (Signed)
[employer] sent to [employer] [phone]

## 2023-02-12 NOTE — Patient Instructions (Signed)
CT angiogram head and neck, I will contact you to go over the results Continue follow-up PCP Return as needed.

## 2023-02-13 DIAGNOSIS — F411 Generalized anxiety disorder: Secondary | ICD-10-CM | POA: Diagnosis not present

## 2023-02-13 DIAGNOSIS — F331 Major depressive disorder, recurrent, moderate: Secondary | ICD-10-CM | POA: Diagnosis not present

## 2023-02-18 DIAGNOSIS — M5442 Lumbago with sciatica, left side: Secondary | ICD-10-CM | POA: Diagnosis not present

## 2023-02-18 DIAGNOSIS — M9903 Segmental and somatic dysfunction of lumbar region: Secondary | ICD-10-CM | POA: Diagnosis not present

## 2023-02-18 DIAGNOSIS — M546 Pain in thoracic spine: Secondary | ICD-10-CM | POA: Diagnosis not present

## 2023-02-18 DIAGNOSIS — M9902 Segmental and somatic dysfunction of thoracic region: Secondary | ICD-10-CM | POA: Diagnosis not present

## 2023-02-20 ENCOUNTER — Ambulatory Visit (HOSPITAL_COMMUNITY)
Admission: RE | Admit: 2023-02-20 | Discharge: 2023-02-20 | Disposition: A | Discharge status: Home / Self Care | Payer: Federal, State, Local not specified - PPO | Source: Ambulatory Visit | Attending: Neurology | Admitting: Neurology

## 2023-02-20 ENCOUNTER — Encounter (HOSPITAL_COMMUNITY): Payer: Self-pay

## 2023-02-20 DIAGNOSIS — H93A1 Pulsatile tinnitus, right ear: Secondary | ICD-10-CM | POA: Insufficient documentation

## 2023-02-20 DIAGNOSIS — F411 Generalized anxiety disorder: Secondary | ICD-10-CM | POA: Diagnosis not present

## 2023-02-20 DIAGNOSIS — H93A9 Pulsatile tinnitus, unspecified ear: Secondary | ICD-10-CM | POA: Diagnosis not present

## 2023-02-20 DIAGNOSIS — F331 Major depressive disorder, recurrent, moderate: Secondary | ICD-10-CM | POA: Diagnosis not present

## 2023-02-20 MED ORDER — SODIUM CHLORIDE (PF) 0.9 % IJ SOLN
INTRAMUSCULAR | Status: AC
  Filled 2023-02-20: qty 50

## 2023-02-20 MED ORDER — IOHEXOL 350 MG/ML SOLN
100.0000 mL | Freq: Once | INTRAVENOUS | Status: AC | PRN
  Administered 2023-02-20: 75 mL via INTRAVENOUS

## 2023-02-28 ENCOUNTER — Encounter: Payer: Self-pay | Admitting: Neurology

## 2023-03-07 DIAGNOSIS — F331 Major depressive disorder, recurrent, moderate: Secondary | ICD-10-CM | POA: Diagnosis not present

## 2023-03-07 DIAGNOSIS — F411 Generalized anxiety disorder: Secondary | ICD-10-CM | POA: Diagnosis not present

## 2023-03-07 NOTE — Telephone Encounter (Signed)
Please call and advise the patient that the recent CT Angiogram head and neck did not show any abnormality such as vascular malformation or brain aneurysm. No further action is required on this test at this time. Please remind patient to keep any upcoming appointments or tests and to call us with any interim questions, concerns, problems or updates. Thanks,   Windell Norfolk, MD

## 2023-03-13 DIAGNOSIS — F331 Major depressive disorder, recurrent, moderate: Secondary | ICD-10-CM | POA: Diagnosis not present

## 2023-03-13 DIAGNOSIS — F411 Generalized anxiety disorder: Secondary | ICD-10-CM | POA: Diagnosis not present

## 2023-03-20 DIAGNOSIS — F331 Major depressive disorder, recurrent, moderate: Secondary | ICD-10-CM | POA: Diagnosis not present

## 2023-03-20 DIAGNOSIS — F411 Generalized anxiety disorder: Secondary | ICD-10-CM | POA: Diagnosis not present

## 2023-03-22 DIAGNOSIS — M9902 Segmental and somatic dysfunction of thoracic region: Secondary | ICD-10-CM | POA: Diagnosis not present

## 2023-03-22 DIAGNOSIS — M546 Pain in thoracic spine: Secondary | ICD-10-CM | POA: Diagnosis not present

## 2023-03-22 DIAGNOSIS — M9903 Segmental and somatic dysfunction of lumbar region: Secondary | ICD-10-CM | POA: Diagnosis not present

## 2023-03-22 DIAGNOSIS — M5442 Lumbago with sciatica, left side: Secondary | ICD-10-CM | POA: Diagnosis not present

## 2023-04-03 DIAGNOSIS — F411 Generalized anxiety disorder: Secondary | ICD-10-CM | POA: Diagnosis not present

## 2023-04-03 DIAGNOSIS — F331 Major depressive disorder, recurrent, moderate: Secondary | ICD-10-CM | POA: Diagnosis not present

## 2023-04-09 DIAGNOSIS — F411 Generalized anxiety disorder: Secondary | ICD-10-CM | POA: Diagnosis not present

## 2023-04-09 DIAGNOSIS — F331 Major depressive disorder, recurrent, moderate: Secondary | ICD-10-CM | POA: Diagnosis not present

## 2023-04-17 DIAGNOSIS — F331 Major depressive disorder, recurrent, moderate: Secondary | ICD-10-CM | POA: Diagnosis not present

## 2023-04-17 DIAGNOSIS — F411 Generalized anxiety disorder: Secondary | ICD-10-CM | POA: Diagnosis not present

## 2023-04-24 DIAGNOSIS — F411 Generalized anxiety disorder: Secondary | ICD-10-CM | POA: Diagnosis not present

## 2023-04-24 DIAGNOSIS — F331 Major depressive disorder, recurrent, moderate: Secondary | ICD-10-CM | POA: Diagnosis not present

## 2023-05-01 DIAGNOSIS — F331 Major depressive disorder, recurrent, moderate: Secondary | ICD-10-CM | POA: Diagnosis not present

## 2023-05-01 DIAGNOSIS — F411 Generalized anxiety disorder: Secondary | ICD-10-CM | POA: Diagnosis not present

## 2023-05-08 DIAGNOSIS — F411 Generalized anxiety disorder: Secondary | ICD-10-CM | POA: Diagnosis not present

## 2023-05-08 DIAGNOSIS — F331 Major depressive disorder, recurrent, moderate: Secondary | ICD-10-CM | POA: Diagnosis not present

## 2023-05-15 DIAGNOSIS — F411 Generalized anxiety disorder: Secondary | ICD-10-CM | POA: Diagnosis not present

## 2023-05-15 DIAGNOSIS — F331 Major depressive disorder, recurrent, moderate: Secondary | ICD-10-CM | POA: Diagnosis not present

## 2023-05-24 DIAGNOSIS — M546 Pain in thoracic spine: Secondary | ICD-10-CM | POA: Diagnosis not present

## 2023-05-24 DIAGNOSIS — M9903 Segmental and somatic dysfunction of lumbar region: Secondary | ICD-10-CM | POA: Diagnosis not present

## 2023-05-24 DIAGNOSIS — M5442 Lumbago with sciatica, left side: Secondary | ICD-10-CM | POA: Diagnosis not present

## 2023-05-24 DIAGNOSIS — M9902 Segmental and somatic dysfunction of thoracic region: Secondary | ICD-10-CM | POA: Diagnosis not present

## 2023-05-29 DIAGNOSIS — F331 Major depressive disorder, recurrent, moderate: Secondary | ICD-10-CM | POA: Diagnosis not present

## 2023-05-29 DIAGNOSIS — F411 Generalized anxiety disorder: Secondary | ICD-10-CM | POA: Diagnosis not present

## 2023-05-31 DIAGNOSIS — M545 Low back pain, unspecified: Secondary | ICD-10-CM | POA: Diagnosis not present

## 2023-06-05 DIAGNOSIS — F331 Major depressive disorder, recurrent, moderate: Secondary | ICD-10-CM | POA: Diagnosis not present

## 2023-06-05 DIAGNOSIS — F411 Generalized anxiety disorder: Secondary | ICD-10-CM | POA: Diagnosis not present

## 2023-06-19 DIAGNOSIS — F331 Major depressive disorder, recurrent, moderate: Secondary | ICD-10-CM | POA: Diagnosis not present

## 2023-06-19 DIAGNOSIS — F411 Generalized anxiety disorder: Secondary | ICD-10-CM | POA: Diagnosis not present

## 2023-06-24 DIAGNOSIS — M5442 Lumbago with sciatica, left side: Secondary | ICD-10-CM | POA: Diagnosis not present

## 2023-06-24 DIAGNOSIS — M9902 Segmental and somatic dysfunction of thoracic region: Secondary | ICD-10-CM | POA: Diagnosis not present

## 2023-06-24 DIAGNOSIS — M9903 Segmental and somatic dysfunction of lumbar region: Secondary | ICD-10-CM | POA: Diagnosis not present

## 2023-06-24 DIAGNOSIS — M546 Pain in thoracic spine: Secondary | ICD-10-CM | POA: Diagnosis not present

## 2023-06-24 DIAGNOSIS — Z1231 Encounter for screening mammogram for malignant neoplasm of breast: Secondary | ICD-10-CM | POA: Diagnosis not present

## 2023-06-26 DIAGNOSIS — F331 Major depressive disorder, recurrent, moderate: Secondary | ICD-10-CM | POA: Diagnosis not present

## 2023-06-26 DIAGNOSIS — F411 Generalized anxiety disorder: Secondary | ICD-10-CM | POA: Diagnosis not present

## 2023-06-28 DIAGNOSIS — H93A1 Pulsatile tinnitus, right ear: Secondary | ICD-10-CM | POA: Diagnosis not present

## 2023-07-01 DIAGNOSIS — F411 Generalized anxiety disorder: Secondary | ICD-10-CM | POA: Diagnosis not present

## 2023-07-01 DIAGNOSIS — F331 Major depressive disorder, recurrent, moderate: Secondary | ICD-10-CM | POA: Diagnosis not present

## 2023-07-03 DIAGNOSIS — N6011 Diffuse cystic mastopathy of right breast: Secondary | ICD-10-CM | POA: Diagnosis not present

## 2023-07-03 DIAGNOSIS — R928 Other abnormal and inconclusive findings on diagnostic imaging of breast: Secondary | ICD-10-CM | POA: Diagnosis not present

## 2023-07-03 DIAGNOSIS — N6001 Solitary cyst of right breast: Secondary | ICD-10-CM | POA: Diagnosis not present

## 2023-07-03 DIAGNOSIS — R92333 Mammographic heterogeneous density, bilateral breasts: Secondary | ICD-10-CM | POA: Diagnosis not present

## 2023-07-03 DIAGNOSIS — N6459 Other signs and symptoms in breast: Secondary | ICD-10-CM | POA: Diagnosis not present

## 2023-07-24 DIAGNOSIS — D2239 Melanocytic nevi of other parts of face: Secondary | ICD-10-CM | POA: Diagnosis not present

## 2023-07-24 DIAGNOSIS — L709 Acne, unspecified: Secondary | ICD-10-CM | POA: Diagnosis not present

## 2023-07-24 DIAGNOSIS — D229 Melanocytic nevi, unspecified: Secondary | ICD-10-CM | POA: Diagnosis not present

## 2023-07-24 DIAGNOSIS — L810 Postinflammatory hyperpigmentation: Secondary | ICD-10-CM | POA: Diagnosis not present

## 2023-07-24 DIAGNOSIS — D224 Melanocytic nevi of scalp and neck: Secondary | ICD-10-CM | POA: Diagnosis not present

## 2023-07-31 DIAGNOSIS — F411 Generalized anxiety disorder: Secondary | ICD-10-CM | POA: Diagnosis not present

## 2023-07-31 DIAGNOSIS — F331 Major depressive disorder, recurrent, moderate: Secondary | ICD-10-CM | POA: Diagnosis not present

## 2023-08-01 DIAGNOSIS — Z0189 Encounter for other specified special examinations: Secondary | ICD-10-CM | POA: Diagnosis not present

## 2023-08-05 DIAGNOSIS — M546 Pain in thoracic spine: Secondary | ICD-10-CM | POA: Diagnosis not present

## 2023-08-05 DIAGNOSIS — M9903 Segmental and somatic dysfunction of lumbar region: Secondary | ICD-10-CM | POA: Diagnosis not present

## 2023-08-05 DIAGNOSIS — M5442 Lumbago with sciatica, left side: Secondary | ICD-10-CM | POA: Diagnosis not present

## 2023-08-05 DIAGNOSIS — M9902 Segmental and somatic dysfunction of thoracic region: Secondary | ICD-10-CM | POA: Diagnosis not present

## 2023-08-06 DIAGNOSIS — L989 Disorder of the skin and subcutaneous tissue, unspecified: Secondary | ICD-10-CM | POA: Diagnosis not present

## 2023-08-07 DIAGNOSIS — F331 Major depressive disorder, recurrent, moderate: Secondary | ICD-10-CM | POA: Diagnosis not present

## 2023-08-07 DIAGNOSIS — F411 Generalized anxiety disorder: Secondary | ICD-10-CM | POA: Diagnosis not present

## 2023-08-14 DIAGNOSIS — F411 Generalized anxiety disorder: Secondary | ICD-10-CM | POA: Diagnosis not present

## 2023-08-14 DIAGNOSIS — F331 Major depressive disorder, recurrent, moderate: Secondary | ICD-10-CM | POA: Diagnosis not present

## 2023-08-16 DIAGNOSIS — Z0189 Encounter for other specified special examinations: Secondary | ICD-10-CM | POA: Diagnosis not present

## 2023-08-21 ENCOUNTER — Telehealth (INDEPENDENT_AMBULATORY_CARE_PROVIDER_SITE_OTHER): Payer: Self-pay | Admitting: Otolaryngology

## 2023-08-21 DIAGNOSIS — M79675 Pain in left toe(s): Secondary | ICD-10-CM | POA: Diagnosis not present

## 2023-08-21 DIAGNOSIS — M79674 Pain in right toe(s): Secondary | ICD-10-CM | POA: Diagnosis not present

## 2023-08-21 NOTE — Telephone Encounter (Signed)
 Confirmed appt and address with patient for 08/22/2023.

## 2023-08-22 ENCOUNTER — Encounter (INDEPENDENT_AMBULATORY_CARE_PROVIDER_SITE_OTHER): Payer: Self-pay

## 2023-08-22 ENCOUNTER — Ambulatory Visit (INDEPENDENT_AMBULATORY_CARE_PROVIDER_SITE_OTHER): Payer: No Typology Code available for payment source | Admitting: Otolaryngology

## 2023-08-22 VITALS — BP 118/72 | HR 89 | Ht 61.0 in | Wt 120.0 lb

## 2023-08-22 DIAGNOSIS — H93A1 Pulsatile tinnitus, right ear: Secondary | ICD-10-CM

## 2023-08-25 DIAGNOSIS — H93A1 Pulsatile tinnitus, right ear: Secondary | ICD-10-CM | POA: Insufficient documentation

## 2023-08-25 NOTE — Progress Notes (Signed)
 Patient ID: Misty Mendoza, female   DOB: 1985-10-24, 38 y.o.   MRN: [id number]  CC: Right ear tinnitus  HPI:  Misty Mendoza is a 38 y.o. female who presents today complaining of a constant right ear tinnitus.  She describes her tinnitus as a pulsatile noise, with occasional high-pitched ringing sound.  She has been symptomatic for more than 2 years.  She has no left ear symptoms.  She was recently evaluated by an audiologist at a [facility].  Into the patient, her hearing test was normal.  The patient denies any otalgia, otorrhea, or vertigo.  She has no previous otologic surgery.  Past Medical History:  Diagnosis Date   Arthralgia    right hip   Gastritis    GERD (gastroesophageal reflux disease)    Lumbar radiculopathy, right    Sciatica of right side    Thyroid  enlargement    right- pt unsure about this dx 05-25-16    Past Surgical History:  Procedure Laterality Date   laproscopic endometrious removal Left    right hip arthroscopy  10/05/2009    Family History  Problem Relation Age of Onset   Breast cancer Mother    Heart attack Father        in his 15's    Heart disease Father    Colon cancer Maternal Aunt    Colon cancer Maternal Grandmother    Esophageal cancer Neg Hx    Rectal cancer Neg Hx    Stomach cancer Neg Hx     Social History:  reports that she has quit smoking. She has never used smokeless tobacco. She reports current alcohol use. She reports that she does not use drugs.  Allergies: No Known Allergies  Prior to Admission medications   Medication Sig Start Date End Date Taking? Authorizing Provider  DULoxetine (CYMBALTA) 20 MG capsule Take 20 mg by mouth 2 (two) times daily. 06/18/23  Yes [provider]  HYDROcodone-acetaminophen  (NORCO/VICODIN) 5-325 MG tablet Take 1 tablet by mouth every 8 (eight) hours as needed. 06/29/14  Yes [provider]  ibuprofen (ADVIL) 600 MG tablet Take 600 mg by mouth 3 (three) times daily. 02/05/22  Yes  [provider]  Levonorgestrel-Ethinyl Estrad (VIENVA PO) Take 1 tablet by mouth daily.   Yes [provider]  tretinoin (RETIN-A) 0.1 % cream APPLY SMALL AMOUNT TO AFFECTED AREA AS DIRECTED BY YOUR PROVIDER FOR ACNE -  APPLY A PEA-SIZED AMOUNT. START USING ONLY 1-2 NIGHTS PER WEEK, THEN  INCREASE BY ONE NIGHT PER WEEK AS TOLERATED. MUST USE SUNSCREEN WHILE  USING THIS MEDICATION. -  APPLY A PEA-SIZED AMOUNT. START USING ONLY 1-2 NIGHTS PER WEEK, THEN   INCREASE BY ONE NIGHT PER WEEK AS TOLERATED. MUST USE SUNSCREEN WHILE  USING THIS MEDICATION. 07/24/23  Yes [provider]  VIENVA 0.1-20 MG-MCG tablet Take 1 tablet by mouth daily.   Yes [provider]  dicyclomine  (BENTYL ) 20 MG tablet Take 1 tablet (20 mg total) by mouth every 6 (six) hours as needed for spasms. Patient not taking: Reported on 08/22/2023 05/10/16   Avram Lupita BRAVO, MD  erythromycin with ethanol (EMGEL) 2 % gel Apply 1 application  topically. Patient not taking: Reported on 08/22/2023    [provider]  ESTARYLLA 0.25-35 MG-MCG tablet Take 1 tablet by mouth daily. Patient not taking: Reported on 08/22/2023    [provider]  Norgestimate-Ethinyl Estradiol Triphasic (TRINESSA, 28,) 0.18/0.215/0.25 MG-35 MCG tablet Take 1 tablet by mouth daily.  Patient not taking: Reported on 08/22/2023    [provider]    Blood pressure 118/72, pulse 89, height 5' 1 (1.549 m), weight 120 lb (54.4 kg), SpO2 97%. Exam: General: Communicates without difficulty, well nourished, no acute distress. Head: Normocephalic, no evidence injury, no tenderness, facial buttresses intact without stepoff. Face/sinus: No tenderness to palpation and percussion. Facial movement is normal and symmetric. Eyes: PERRL, EOMI. No scleral icterus, conjunctivae clear. Neuro: CN II exam reveals vision grossly intact.  No nystagmus at any point of gaze. Ears: Auricles well formed without lesions.  Ear canals are intact  without mass or lesion.  No erythema or edema is appreciated.  The TMs are intact without fluid. Nose: External evaluation reveals normal support and skin without lesions.  Dorsum is intact.  Anterior rhinoscopy reveals normal mucosa over anterior aspect of inferior turbinates and intact septum.  No purulence noted. Oral:  Oral cavity and oropharynx are intact, symmetric, without erythema or edema.  Mucosa is moist without lesions. Neck: Full range of motion without pain.  There is no significant lymphadenopathy.  No masses palpable.  Thyroid  bed within normal limits to palpation.  Parotid glands and submandibular glands equal bilaterally without mass.  Trachea is midline. Neuro:  CN 2-12 grossly intact.   Assessment: 1.  Right ear pulsatile tinnitus of unknown etiology. The possible differential diagnoses include vascular tumor, AV malformation, hyperdynamic state, aneurysm, sinus thrombosis, or other idiopathic causes.   2.  Her ear canals, tympanic membranes, and middle ear spaces are normal.  No middle ear effusion or infection is noted. 3.  According to the patient, her hearing test at her [facility] was normal.  Plan: 1.  The physical exam findings are reviewed with the patient. 2.  The strategies to cope with tinnitus, including the use of masker, hearing aids, tinnitus retraining therapy, and avoidance of caffeine and alcohol are discussed.  3.  MRI scan/CT angiography to evaluate for possible causes of her pulsatile tinnitus. 4.  The patient will return for reevaluation after her imaging study.  Lynnita Somma W Quantavius Humm 08/25/2023, 4:37 PM

## 2023-08-26 ENCOUNTER — Ambulatory Visit (HOSPITAL_COMMUNITY)
Admission: RE | Admit: 2023-08-26 | Discharge: 2023-08-26 | Disposition: A | Discharge status: Home / Self Care | Payer: No Typology Code available for payment source | Source: Ambulatory Visit | Attending: Otolaryngology | Admitting: Otolaryngology

## 2023-08-26 DIAGNOSIS — H93A1 Pulsatile tinnitus, right ear: Secondary | ICD-10-CM | POA: Diagnosis not present

## 2023-08-26 MED ORDER — GADOBUTROL 1 MMOL/ML IV SOLN
5.0000 mL | Freq: Once | INTRAVENOUS | Status: AC | PRN
  Administered 2023-08-26: 5 mL via INTRAVENOUS

## 2023-09-02 DIAGNOSIS — M9903 Segmental and somatic dysfunction of lumbar region: Secondary | ICD-10-CM | POA: Diagnosis not present

## 2023-09-02 DIAGNOSIS — M9902 Segmental and somatic dysfunction of thoracic region: Secondary | ICD-10-CM | POA: Diagnosis not present

## 2023-09-02 DIAGNOSIS — M5442 Lumbago with sciatica, left side: Secondary | ICD-10-CM | POA: Diagnosis not present

## 2023-09-02 DIAGNOSIS — M546 Pain in thoracic spine: Secondary | ICD-10-CM | POA: Diagnosis not present

## 2023-09-04 DIAGNOSIS — F411 Generalized anxiety disorder: Secondary | ICD-10-CM | POA: Diagnosis not present

## 2023-09-04 DIAGNOSIS — F331 Major depressive disorder, recurrent, moderate: Secondary | ICD-10-CM | POA: Diagnosis not present

## 2023-09-06 DIAGNOSIS — R0989 Other specified symptoms and signs involving the circulatory and respiratory systems: Secondary | ICD-10-CM | POA: Diagnosis not present

## 2023-09-20 DIAGNOSIS — T699XXS Effect of reduced temperature, unspecified, sequela: Secondary | ICD-10-CM | POA: Diagnosis not present

## 2023-09-20 DIAGNOSIS — I739 Peripheral vascular disease, unspecified: Secondary | ICD-10-CM | POA: Diagnosis not present

## 2023-09-20 DIAGNOSIS — F17290 Nicotine dependence, other tobacco product, uncomplicated: Secondary | ICD-10-CM | POA: Diagnosis not present

## 2023-09-20 DIAGNOSIS — Z7982 Long term (current) use of aspirin: Secondary | ICD-10-CM | POA: Diagnosis not present

## 2023-10-07 DIAGNOSIS — M546 Pain in thoracic spine: Secondary | ICD-10-CM | POA: Diagnosis not present

## 2023-10-07 DIAGNOSIS — M9902 Segmental and somatic dysfunction of thoracic region: Secondary | ICD-10-CM | POA: Diagnosis not present

## 2023-10-07 DIAGNOSIS — M5442 Lumbago with sciatica, left side: Secondary | ICD-10-CM | POA: Diagnosis not present

## 2023-10-07 DIAGNOSIS — M9903 Segmental and somatic dysfunction of lumbar region: Secondary | ICD-10-CM | POA: Diagnosis not present

## 2023-10-17 DIAGNOSIS — F411 Generalized anxiety disorder: Secondary | ICD-10-CM | POA: Diagnosis not present

## 2023-10-17 DIAGNOSIS — F331 Major depressive disorder, recurrent, moderate: Secondary | ICD-10-CM | POA: Diagnosis not present

## 2023-10-31 DIAGNOSIS — F411 Generalized anxiety disorder: Secondary | ICD-10-CM | POA: Diagnosis not present

## 2023-11-11 DIAGNOSIS — M546 Pain in thoracic spine: Secondary | ICD-10-CM | POA: Diagnosis not present

## 2023-11-11 DIAGNOSIS — M5442 Lumbago with sciatica, left side: Secondary | ICD-10-CM | POA: Diagnosis not present

## 2023-11-11 DIAGNOSIS — M9903 Segmental and somatic dysfunction of lumbar region: Secondary | ICD-10-CM | POA: Diagnosis not present

## 2023-11-11 DIAGNOSIS — M9902 Segmental and somatic dysfunction of thoracic region: Secondary | ICD-10-CM | POA: Diagnosis not present

## 2023-11-25 DIAGNOSIS — M25551 Pain in right hip: Secondary | ICD-10-CM | POA: Diagnosis not present

## 2023-11-25 DIAGNOSIS — Z0000 Encounter for general adult medical examination without abnormal findings: Secondary | ICD-10-CM | POA: Diagnosis not present

## 2023-11-28 DIAGNOSIS — F411 Generalized anxiety disorder: Secondary | ICD-10-CM | POA: Diagnosis not present

## 2023-11-28 DIAGNOSIS — F331 Major depressive disorder, recurrent, moderate: Secondary | ICD-10-CM | POA: Diagnosis not present

## 2023-12-02 DIAGNOSIS — Z0000 Encounter for general adult medical examination without abnormal findings: Secondary | ICD-10-CM | POA: Diagnosis not present

## 2023-12-02 DIAGNOSIS — E0590 Thyrotoxicosis, unspecified without thyrotoxic crisis or storm: Secondary | ICD-10-CM | POA: Diagnosis not present

## 2023-12-06 ENCOUNTER — Other Ambulatory Visit: Payer: Self-pay | Admitting: Internal Medicine

## 2023-12-06 DIAGNOSIS — E0590 Thyrotoxicosis, unspecified without thyrotoxic crisis or storm: Secondary | ICD-10-CM

## 2023-12-06 DIAGNOSIS — M9903 Segmental and somatic dysfunction of lumbar region: Secondary | ICD-10-CM | POA: Diagnosis not present

## 2023-12-06 DIAGNOSIS — M5442 Lumbago with sciatica, left side: Secondary | ICD-10-CM | POA: Diagnosis not present

## 2023-12-06 DIAGNOSIS — M546 Pain in thoracic spine: Secondary | ICD-10-CM | POA: Diagnosis not present

## 2023-12-06 DIAGNOSIS — M9902 Segmental and somatic dysfunction of thoracic region: Secondary | ICD-10-CM | POA: Diagnosis not present

## 2023-12-11 ENCOUNTER — Ambulatory Visit
Admission: RE | Admit: 2023-12-11 | Discharge: 2023-12-11 | Discharge status: Home / Self Care | Source: Ambulatory Visit | Attending: Internal Medicine | Admitting: Internal Medicine

## 2023-12-11 DIAGNOSIS — E0590 Thyrotoxicosis, unspecified without thyrotoxic crisis or storm: Secondary | ICD-10-CM

## 2023-12-11 DIAGNOSIS — E0580 Other thyrotoxicosis without thyrotoxic crisis or storm: Secondary | ICD-10-CM | POA: Diagnosis not present

## 2023-12-12 DIAGNOSIS — F411 Generalized anxiety disorder: Secondary | ICD-10-CM | POA: Diagnosis not present

## 2023-12-12 DIAGNOSIS — F331 Major depressive disorder, recurrent, moderate: Secondary | ICD-10-CM | POA: Diagnosis not present

## 2023-12-16 DIAGNOSIS — R5383 Other fatigue: Secondary | ICD-10-CM | POA: Diagnosis not present

## 2023-12-19 DIAGNOSIS — R29818 Other symptoms and signs involving the nervous system: Secondary | ICD-10-CM | POA: Diagnosis not present

## 2023-12-19 DIAGNOSIS — M7918 Myalgia, other site: Secondary | ICD-10-CM | POA: Diagnosis not present

## 2023-12-19 DIAGNOSIS — M2550 Pain in unspecified joint: Secondary | ICD-10-CM | POA: Diagnosis not present

## 2023-12-19 DIAGNOSIS — E0590 Thyrotoxicosis, unspecified without thyrotoxic crisis or storm: Secondary | ICD-10-CM | POA: Diagnosis not present

## 2023-12-31 DIAGNOSIS — G4710 Hypersomnia, unspecified: Secondary | ICD-10-CM | POA: Diagnosis not present

## 2023-12-31 DIAGNOSIS — R760 Raised antibody titer: Secondary | ICD-10-CM | POA: Diagnosis not present

## 2023-12-31 DIAGNOSIS — G4733 Obstructive sleep apnea (adult) (pediatric): Secondary | ICD-10-CM | POA: Diagnosis not present

## 2023-12-31 DIAGNOSIS — G2581 Restless legs syndrome: Secondary | ICD-10-CM | POA: Diagnosis not present

## 2024-01-06 DIAGNOSIS — M9902 Segmental and somatic dysfunction of thoracic region: Secondary | ICD-10-CM | POA: Diagnosis not present

## 2024-01-06 DIAGNOSIS — M5442 Lumbago with sciatica, left side: Secondary | ICD-10-CM | POA: Diagnosis not present

## 2024-01-06 DIAGNOSIS — M9903 Segmental and somatic dysfunction of lumbar region: Secondary | ICD-10-CM | POA: Diagnosis not present

## 2024-01-06 DIAGNOSIS — M546 Pain in thoracic spine: Secondary | ICD-10-CM | POA: Diagnosis not present

## 2024-01-08 DIAGNOSIS — G2581 Restless legs syndrome: Secondary | ICD-10-CM | POA: Diagnosis not present

## 2024-01-08 DIAGNOSIS — G4710 Hypersomnia, unspecified: Secondary | ICD-10-CM | POA: Diagnosis not present

## 2024-01-08 DIAGNOSIS — G4733 Obstructive sleep apnea (adult) (pediatric): Secondary | ICD-10-CM | POA: Diagnosis not present

## 2024-01-08 DIAGNOSIS — R760 Raised antibody titer: Secondary | ICD-10-CM | POA: Diagnosis not present

## 2024-01-21 DIAGNOSIS — G2581 Restless legs syndrome: Secondary | ICD-10-CM | POA: Diagnosis not present

## 2024-01-21 DIAGNOSIS — G4710 Hypersomnia, unspecified: Secondary | ICD-10-CM | POA: Diagnosis not present

## 2024-01-21 DIAGNOSIS — G4733 Obstructive sleep apnea (adult) (pediatric): Secondary | ICD-10-CM | POA: Diagnosis not present

## 2024-01-21 DIAGNOSIS — R760 Raised antibody titer: Secondary | ICD-10-CM | POA: Diagnosis not present

## 2024-02-17 DIAGNOSIS — R768 Other specified abnormal immunological findings in serum: Secondary | ICD-10-CM | POA: Diagnosis not present

## 2024-02-17 DIAGNOSIS — M25562 Pain in left knee: Secondary | ICD-10-CM | POA: Diagnosis not present

## 2024-02-17 DIAGNOSIS — M79641 Pain in right hand: Secondary | ICD-10-CM | POA: Diagnosis not present

## 2024-02-17 DIAGNOSIS — M79671 Pain in right foot: Secondary | ICD-10-CM | POA: Diagnosis not present

## 2024-02-17 DIAGNOSIS — M25561 Pain in right knee: Secondary | ICD-10-CM | POA: Diagnosis not present

## 2024-02-17 DIAGNOSIS — M79672 Pain in left foot: Secondary | ICD-10-CM | POA: Diagnosis not present

## 2024-02-17 DIAGNOSIS — R5383 Other fatigue: Secondary | ICD-10-CM | POA: Diagnosis not present

## 2024-02-17 DIAGNOSIS — M2550 Pain in unspecified joint: Secondary | ICD-10-CM | POA: Diagnosis not present

## 2024-02-17 DIAGNOSIS — M79642 Pain in left hand: Secondary | ICD-10-CM | POA: Diagnosis not present

## 2024-02-19 DIAGNOSIS — M9903 Segmental and somatic dysfunction of lumbar region: Secondary | ICD-10-CM | POA: Diagnosis not present

## 2024-02-19 DIAGNOSIS — M9902 Segmental and somatic dysfunction of thoracic region: Secondary | ICD-10-CM | POA: Diagnosis not present

## 2024-02-19 DIAGNOSIS — M546 Pain in thoracic spine: Secondary | ICD-10-CM | POA: Diagnosis not present

## 2024-02-19 DIAGNOSIS — M5442 Lumbago with sciatica, left side: Secondary | ICD-10-CM | POA: Diagnosis not present

## 2024-03-13 DIAGNOSIS — R768 Other specified abnormal immunological findings in serum: Secondary | ICD-10-CM | POA: Diagnosis not present

## 2024-03-13 DIAGNOSIS — R5383 Other fatigue: Secondary | ICD-10-CM | POA: Diagnosis not present

## 2024-03-13 DIAGNOSIS — M25561 Pain in right knee: Secondary | ICD-10-CM | POA: Diagnosis not present

## 2024-03-13 DIAGNOSIS — M545 Low back pain, unspecified: Secondary | ICD-10-CM | POA: Diagnosis not present

## 2024-03-13 DIAGNOSIS — M2550 Pain in unspecified joint: Secondary | ICD-10-CM | POA: Diagnosis not present

## 2024-03-18 DIAGNOSIS — M546 Pain in thoracic spine: Secondary | ICD-10-CM | POA: Diagnosis not present

## 2024-03-18 DIAGNOSIS — M5442 Lumbago with sciatica, left side: Secondary | ICD-10-CM | POA: Diagnosis not present

## 2024-03-18 DIAGNOSIS — M9903 Segmental and somatic dysfunction of lumbar region: Secondary | ICD-10-CM | POA: Diagnosis not present

## 2024-03-18 DIAGNOSIS — M9902 Segmental and somatic dysfunction of thoracic region: Secondary | ICD-10-CM | POA: Diagnosis not present

## 2024-03-30 DIAGNOSIS — G4733 Obstructive sleep apnea (adult) (pediatric): Secondary | ICD-10-CM | POA: Diagnosis not present

## 2024-03-31 DIAGNOSIS — G4733 Obstructive sleep apnea (adult) (pediatric): Secondary | ICD-10-CM | POA: Diagnosis not present

## 2024-04-02 DIAGNOSIS — G4733 Obstructive sleep apnea (adult) (pediatric): Secondary | ICD-10-CM | POA: Diagnosis not present

## 2024-04-02 DIAGNOSIS — G4710 Hypersomnia, unspecified: Secondary | ICD-10-CM | POA: Diagnosis not present

## 2024-04-13 DIAGNOSIS — M5442 Lumbago with sciatica, left side: Secondary | ICD-10-CM | POA: Diagnosis not present

## 2024-04-13 DIAGNOSIS — M9902 Segmental and somatic dysfunction of thoracic region: Secondary | ICD-10-CM | POA: Diagnosis not present

## 2024-04-13 DIAGNOSIS — M546 Pain in thoracic spine: Secondary | ICD-10-CM | POA: Diagnosis not present

## 2024-04-13 DIAGNOSIS — M9903 Segmental and somatic dysfunction of lumbar region: Secondary | ICD-10-CM | POA: Diagnosis not present

## 2024-04-29 DIAGNOSIS — G4733 Obstructive sleep apnea (adult) (pediatric): Secondary | ICD-10-CM | POA: Diagnosis not present

## 2024-05-05 DIAGNOSIS — Z01419 Encounter for gynecological examination (general) (routine) without abnormal findings: Secondary | ICD-10-CM | POA: Diagnosis not present

## 2024-05-08 DIAGNOSIS — M9903 Segmental and somatic dysfunction of lumbar region: Secondary | ICD-10-CM | POA: Diagnosis not present

## 2024-05-08 DIAGNOSIS — M546 Pain in thoracic spine: Secondary | ICD-10-CM | POA: Diagnosis not present

## 2024-05-08 DIAGNOSIS — M9902 Segmental and somatic dysfunction of thoracic region: Secondary | ICD-10-CM | POA: Diagnosis not present

## 2024-05-08 DIAGNOSIS — M5442 Lumbago with sciatica, left side: Secondary | ICD-10-CM | POA: Diagnosis not present

## 2024-05-26 DIAGNOSIS — F418 Other specified anxiety disorders: Secondary | ICD-10-CM | POA: Diagnosis not present

## 2024-05-26 DIAGNOSIS — M26609 Unspecified temporomandibular joint disorder, unspecified side: Secondary | ICD-10-CM | POA: Diagnosis not present

## 2024-05-26 DIAGNOSIS — G2581 Restless legs syndrome: Secondary | ICD-10-CM | POA: Diagnosis not present

## 2024-05-26 DIAGNOSIS — G4733 Obstructive sleep apnea (adult) (pediatric): Secondary | ICD-10-CM | POA: Diagnosis not present

## 2024-05-29 DIAGNOSIS — E0590 Thyrotoxicosis, unspecified without thyrotoxic crisis or storm: Secondary | ICD-10-CM | POA: Diagnosis not present

## 2024-05-30 DIAGNOSIS — G4733 Obstructive sleep apnea (adult) (pediatric): Secondary | ICD-10-CM | POA: Diagnosis not present

## 2024-06-05 DIAGNOSIS — M359 Systemic involvement of connective tissue, unspecified: Secondary | ICD-10-CM | POA: Diagnosis not present

## 2024-06-05 DIAGNOSIS — M7918 Myalgia, other site: Secondary | ICD-10-CM | POA: Diagnosis not present

## 2024-06-05 DIAGNOSIS — E0590 Thyrotoxicosis, unspecified without thyrotoxic crisis or storm: Secondary | ICD-10-CM | POA: Diagnosis not present

## 2024-06-08 DIAGNOSIS — M9903 Segmental and somatic dysfunction of lumbar region: Secondary | ICD-10-CM | POA: Diagnosis not present

## 2024-06-08 DIAGNOSIS — M546 Pain in thoracic spine: Secondary | ICD-10-CM | POA: Diagnosis not present

## 2024-06-08 DIAGNOSIS — M5442 Lumbago with sciatica, left side: Secondary | ICD-10-CM | POA: Diagnosis not present

## 2024-06-08 DIAGNOSIS — M9902 Segmental and somatic dysfunction of thoracic region: Secondary | ICD-10-CM | POA: Diagnosis not present

## 2024-06-16 DIAGNOSIS — M359 Systemic involvement of connective tissue, unspecified: Secondary | ICD-10-CM | POA: Diagnosis not present

## 2024-06-16 DIAGNOSIS — I7300 Raynaud's syndrome without gangrene: Secondary | ICD-10-CM | POA: Diagnosis not present

## 2024-06-16 DIAGNOSIS — M545 Low back pain, unspecified: Secondary | ICD-10-CM | POA: Diagnosis not present

## 2024-06-16 DIAGNOSIS — Z7969 Long term (current) use of other immunomodulators and immunosuppressants: Secondary | ICD-10-CM | POA: Diagnosis not present

## 2024-07-01 DIAGNOSIS — Z1231 Encounter for screening mammogram for malignant neoplasm of breast: Secondary | ICD-10-CM | POA: Diagnosis not present

## 2024-07-06 DIAGNOSIS — M5442 Lumbago with sciatica, left side: Secondary | ICD-10-CM | POA: Diagnosis not present

## 2024-07-06 DIAGNOSIS — M9902 Segmental and somatic dysfunction of thoracic region: Secondary | ICD-10-CM | POA: Diagnosis not present

## 2024-07-06 DIAGNOSIS — M546 Pain in thoracic spine: Secondary | ICD-10-CM | POA: Diagnosis not present

## 2024-07-06 DIAGNOSIS — M9903 Segmental and somatic dysfunction of lumbar region: Secondary | ICD-10-CM | POA: Diagnosis not present

## 2024-07-07 DIAGNOSIS — G4733 Obstructive sleep apnea (adult) (pediatric): Secondary | ICD-10-CM | POA: Diagnosis not present
